# Patient Record
Sex: Male | Born: 1940 | Race: Black or African American | Hispanic: No | Marital: Married | Smoking: Current every day smoker
Health system: Southern US, Community
[De-identification: ages and names within clinical notes are randomized; demographics above are authoritative.]

## PROBLEM LIST (undated history)

## (undated) DIAGNOSIS — M109 Gout, unspecified: Secondary | ICD-10-CM

## (undated) DIAGNOSIS — K219 Gastro-esophageal reflux disease without esophagitis: Secondary | ICD-10-CM

## (undated) DIAGNOSIS — F419 Anxiety disorder, unspecified: Secondary | ICD-10-CM

## (undated) DIAGNOSIS — E119 Type 2 diabetes mellitus without complications: Secondary | ICD-10-CM

## (undated) DIAGNOSIS — N200 Calculus of kidney: Secondary | ICD-10-CM

## (undated) HISTORY — PX: CHOLECYSTECTOMY: SHX55

---

## 2000-10-12 ENCOUNTER — Encounter: Payer: Self-pay | Admitting: Internal Medicine

## 2000-10-12 ENCOUNTER — Ambulatory Visit (HOSPITAL_COMMUNITY): Admission: RE | Admit: 2000-10-12 | Discharge: 2000-10-12 | Payer: Self-pay | Admitting: Internal Medicine

## 2006-10-30 ENCOUNTER — Emergency Department (HOSPITAL_COMMUNITY): Admission: EM | Admit: 2006-10-30 | Discharge: 2006-10-30 | Payer: Self-pay | Admitting: Family Medicine

## 2010-12-12 ENCOUNTER — Inpatient Hospital Stay (INDEPENDENT_AMBULATORY_CARE_PROVIDER_SITE_OTHER)
Admission: RE | Admit: 2010-12-12 | Discharge: 2010-12-12 | Disposition: A | Discharge status: Home / Self Care | Payer: Medicare Other | Source: Ambulatory Visit | Attending: Family Medicine | Admitting: Family Medicine

## 2010-12-12 DIAGNOSIS — K219 Gastro-esophageal reflux disease without esophagitis: Secondary | ICD-10-CM

## 2010-12-12 DIAGNOSIS — R319 Hematuria, unspecified: Secondary | ICD-10-CM

## 2010-12-12 LAB — POCT URINALYSIS DIP (DEVICE)
Glucose, UA: NEGATIVE mg/dL
Ketones, ur: NEGATIVE mg/dL
Leukocytes, UA: NEGATIVE
Nitrite: NEGATIVE
Protein, ur: NEGATIVE mg/dL
Specific Gravity, Urine: 1.025 (ref 1.005–1.030)
Urobilinogen, UA: 0.2 mg/dL (ref 0.0–1.0)
pH: 5.5 (ref 5.0–8.0)

## 2010-12-19 ENCOUNTER — Inpatient Hospital Stay (INDEPENDENT_AMBULATORY_CARE_PROVIDER_SITE_OTHER)
Admission: RE | Admit: 2010-12-19 | Discharge: 2010-12-19 | Disposition: A | Discharge status: Home / Self Care | Payer: Medicare Other | Source: Ambulatory Visit | Attending: Emergency Medicine | Admitting: Emergency Medicine

## 2010-12-19 DIAGNOSIS — R319 Hematuria, unspecified: Secondary | ICD-10-CM

## 2010-12-19 LAB — POCT URINALYSIS DIP (DEVICE)
Bilirubin Urine: NEGATIVE
Glucose, UA: NEGATIVE mg/dL
Ketones, ur: NEGATIVE mg/dL
Leukocytes, UA: NEGATIVE
Nitrite: NEGATIVE
Protein, ur: NEGATIVE mg/dL
Specific Gravity, Urine: 1.02 (ref 1.005–1.030)
Urobilinogen, UA: 0.2 mg/dL (ref 0.0–1.0)
pH: 5.5 (ref 5.0–8.0)

## 2010-12-20 LAB — URINE CULTURE
Colony Count: NO GROWTH
Culture  Setup Time: 201207151716
Culture: NO GROWTH

## 2017-12-06 ENCOUNTER — Inpatient Hospital Stay (HOSPITAL_COMMUNITY)
Admission: EM | Admit: 2017-12-06 | Discharge: 2017-12-09 | DRG: 682 | Disposition: A | Discharge status: Home Health | Payer: Medicare Other | Attending: Internal Medicine | Admitting: Internal Medicine

## 2017-12-06 ENCOUNTER — Emergency Department (HOSPITAL_COMMUNITY): Payer: Medicare Other

## 2017-12-06 ENCOUNTER — Encounter (HOSPITAL_COMMUNITY): Payer: Self-pay | Admitting: Emergency Medicine

## 2017-12-06 ENCOUNTER — Other Ambulatory Visit: Payer: Self-pay

## 2017-12-06 DIAGNOSIS — K219 Gastro-esophageal reflux disease without esophagitis: Secondary | ICD-10-CM | POA: Diagnosis not present

## 2017-12-06 DIAGNOSIS — N179 Acute kidney failure, unspecified: Secondary | ICD-10-CM | POA: Diagnosis not present

## 2017-12-06 DIAGNOSIS — R748 Abnormal levels of other serum enzymes: Secondary | ICD-10-CM | POA: Diagnosis not present

## 2017-12-06 DIAGNOSIS — M6282 Rhabdomyolysis: Secondary | ICD-10-CM | POA: Diagnosis present

## 2017-12-06 DIAGNOSIS — F17210 Nicotine dependence, cigarettes, uncomplicated: Secondary | ICD-10-CM | POA: Diagnosis present

## 2017-12-06 DIAGNOSIS — R251 Tremor, unspecified: Secondary | ICD-10-CM

## 2017-12-06 DIAGNOSIS — R252 Cramp and spasm: Secondary | ICD-10-CM

## 2017-12-06 DIAGNOSIS — R7401 Elevation of levels of liver transaminase levels: Secondary | ICD-10-CM

## 2017-12-06 DIAGNOSIS — E860 Dehydration: Secondary | ICD-10-CM | POA: Diagnosis present

## 2017-12-06 DIAGNOSIS — Z7984 Long term (current) use of oral hypoglycemic drugs: Secondary | ICD-10-CM

## 2017-12-06 DIAGNOSIS — R066 Hiccough: Secondary | ICD-10-CM | POA: Diagnosis not present

## 2017-12-06 DIAGNOSIS — E871 Hypo-osmolality and hyponatremia: Secondary | ICD-10-CM | POA: Diagnosis present

## 2017-12-06 DIAGNOSIS — G9341 Metabolic encephalopathy: Secondary | ICD-10-CM | POA: Diagnosis present

## 2017-12-06 DIAGNOSIS — E119 Type 2 diabetes mellitus without complications: Secondary | ICD-10-CM

## 2017-12-06 DIAGNOSIS — Z79899 Other long term (current) drug therapy: Secondary | ICD-10-CM

## 2017-12-06 DIAGNOSIS — Z720 Tobacco use: Secondary | ICD-10-CM

## 2017-12-06 DIAGNOSIS — K760 Fatty (change of) liver, not elsewhere classified: Secondary | ICD-10-CM | POA: Diagnosis present

## 2017-12-06 DIAGNOSIS — R740 Nonspecific elevation of levels of transaminase and lactic acid dehydrogenase [LDH]: Secondary | ICD-10-CM

## 2017-12-06 HISTORY — DX: Gastro-esophageal reflux disease without esophagitis: K21.9

## 2017-12-06 HISTORY — DX: Calculus of kidney: N20.0

## 2017-12-06 HISTORY — DX: Type 2 diabetes mellitus without complications: E11.9

## 2017-12-06 HISTORY — DX: Gout, unspecified: M10.9

## 2017-12-06 HISTORY — DX: Anxiety disorder, unspecified: F41.9

## 2017-12-06 LAB — COMPREHENSIVE METABOLIC PANEL
ALBUMIN: 3.8 g/dL (ref 3.5–5.0)
ALK PHOS: 73 U/L (ref 38–126)
ALT: 53 U/L — AB (ref 0–44)
AST: 56 U/L — AB (ref 15–41)
Anion gap: 10 (ref 5–15)
BUN: 49 mg/dL — AB (ref 8–23)
CALCIUM: 8.1 mg/dL — AB (ref 8.9–10.3)
CHLORIDE: 101 mmol/L (ref 98–111)
CO2: 20 mmol/L — AB (ref 22–32)
CREATININE: 2.71 mg/dL — AB (ref 0.61–1.24)
GFR calc Af Amer: 24 mL/min — ABNORMAL LOW (ref >60)
GFR calc non Af Amer: 21 mL/min — ABNORMAL LOW (ref >60)
GLUCOSE: 157 mg/dL — AB (ref 70–99)
Potassium: 4.9 mmol/L (ref 3.5–5.1)
SODIUM: 131 mmol/L — AB (ref 135–145)
Total Bilirubin: 1 mg/dL (ref 0.3–1.2)
Total Protein: 7.4 g/dL (ref 6.5–8.1)

## 2017-12-06 LAB — URINALYSIS, ROUTINE W REFLEX MICROSCOPIC
BILIRUBIN URINE: NEGATIVE
Bacteria, UA: NONE SEEN
Glucose, UA: NEGATIVE mg/dL
Ketones, ur: NEGATIVE mg/dL
LEUKOCYTES UA: NEGATIVE
Nitrite: NEGATIVE
PROTEIN: 100 mg/dL — AB
Specific Gravity, Urine: 1.02 (ref 1.005–1.030)
pH: 5 (ref 5.0–8.0)

## 2017-12-06 LAB — CBC
HEMATOCRIT: 38.4 % — AB (ref 39.0–52.0)
HEMOGLOBIN: 12.4 g/dL — AB (ref 13.0–17.0)
MCH: 26.6 pg (ref 26.0–34.0)
MCHC: 32.3 g/dL (ref 30.0–36.0)
MCV: 82.2 fL (ref 78.0–100.0)
PLATELETS: 190 10*3/uL (ref 150–400)
RBC: 4.67 MIL/uL (ref 4.22–5.81)
RDW: 15.7 % — ABNORMAL HIGH (ref 11.5–15.5)
WBC: 3.3 10*3/uL — ABNORMAL LOW (ref 4.0–10.5)

## 2017-12-06 LAB — CK: CK TOTAL: 521 U/L — AB (ref 49–397)

## 2017-12-06 LAB — CBG MONITORING, ED
GLUCOSE-CAPILLARY: 167 mg/dL — AB (ref 70–99)
Glucose-Capillary: 161 mg/dL — ABNORMAL HIGH (ref 70–99)

## 2017-12-06 LAB — AMMONIA: Ammonia: 32 umol/L (ref 9–35)

## 2017-12-06 LAB — DIFFERENTIAL
BASOS ABS: 0 10*3/uL (ref 0.0–0.1)
BASOS PCT: 0 %
Eosinophils Absolute: 0 10*3/uL (ref 0.0–0.7)
Eosinophils Relative: 0 %
LYMPHS PCT: 32 %
Lymphs Abs: 1 10*3/uL (ref 0.7–4.0)
Monocytes Absolute: 0.7 10*3/uL (ref 0.1–1.0)
Monocytes Relative: 20 %
NEUTROS ABS: 1.6 10*3/uL — AB (ref 1.7–7.7)
NEUTROS PCT: 48 %

## 2017-12-06 LAB — TROPONIN I

## 2017-12-06 LAB — RAPID URINE DRUG SCREEN, HOSP PERFORMED
Amphetamines: NOT DETECTED
Benzodiazepines: POSITIVE — AB
Cocaine: NOT DETECTED
Opiates: NOT DETECTED
TETRAHYDROCANNABINOL: NOT DETECTED

## 2017-12-06 LAB — ACETAMINOPHEN LEVEL: Acetaminophen (Tylenol), Serum: <10 ug/mL — ABNORMAL LOW (ref 10–30)

## 2017-12-06 LAB — PROTIME-INR
INR: 1.01
Prothrombin Time: 13.3 seconds (ref 11.4–15.2)

## 2017-12-06 LAB — SALICYLATE LEVEL: Salicylate Lvl: <7 mg/dL (ref 2.8–30.0)

## 2017-12-06 LAB — ETHANOL

## 2017-12-06 LAB — APTT: aPTT: 36 seconds (ref 24–36)

## 2017-12-06 MED ORDER — METOCLOPRAMIDE HCL 5 MG/ML IJ SOLN
5.0000 mg | Freq: Once | INTRAMUSCULAR | Status: AC
  Administered 2017-12-06: 5 mg via INTRAVENOUS
  Filled 2017-12-06: qty 2

## 2017-12-06 MED ORDER — SODIUM CHLORIDE 0.9 % IV BOLUS
500.0000 mL | Freq: Once | INTRAVENOUS | Status: AC
  Administered 2017-12-06: 500 mL via INTRAVENOUS

## 2017-12-06 MED ORDER — SODIUM CHLORIDE 0.9 % IV SOLN
INTRAVENOUS | Status: DC
  Administered 2017-12-06 – 2017-12-09 (×7): via INTRAVENOUS

## 2017-12-06 MED ORDER — DIPHENHYDRAMINE HCL 50 MG/ML IJ SOLN
25.0000 mg | Freq: Once | INTRAMUSCULAR | Status: AC
  Administered 2017-12-06: 25 mg via INTRAVENOUS
  Filled 2017-12-06: qty 1

## 2017-12-06 MED ORDER — SODIUM CHLORIDE 0.9 % IV BOLUS
1000.0000 mL | Freq: Once | INTRAVENOUS | Status: AC
  Administered 2017-12-06: 1000 mL via INTRAVENOUS

## 2017-12-06 NOTE — ED Notes (Signed)
Patient transported to X-ray 

## 2017-12-06 NOTE — ED Provider Notes (Signed)
[facility] EMERGENCY DEPARTMENT Provider Note   CSN: [id number] Arrival date & time: 12/06/17  1411     History   Chief Complaint Chief Complaint  Patient presents with  . Weakness    HPI Tyler Rodgers is a 77 y.o. male.  The history is provided by the patient, the spouse and a relative. The history is limited by the condition of the patient (limited historian).  Weakness   Pt was seen at 1550. Per pt, pt's wife and daughter:  Pt presents to ED today for unknown symptoms which may have started 2 weeks ago, 1 week ago, or Sunday (3 days ago). Symptoms may include: not talking a lot, generalized muscles cramping, not sleeping well, hiccups. At one point, pt's wife mentions the pt had CP, which pt denies. Pt's daughter states pt was evaluated by his PMD 2 days ago for unknown symptoms and "prescribed a medicine" (which is unknown to pt, pt's wife and pt's daughter). Pt states he "doesn't remember" what he was told 2 days ago at his doctor's office or why he went. Pt's daughter showed ED staff a picture of a pill bottle (Pletal), but pt, pt's wife, and pt's daughter do not know when pt filled it, who prescribed it, or for what medical condition. Pt currently only c/o hiccups and generalized muscles cramping and is asking for "some medicine to stop it." Denies CP, abd pain, back pain, N/V/D, fevers, focal motor weakness, no tingling/numbness in extremities.     Past Medical History:  Diagnosis Date  . Anxiety   . Diabetes mellitus without complication ([place])   . GERD (gastroesophageal reflux disease)   . Gout   . Kidney stone     Patient Active Problem List   Diagnosis Date Noted  . AKI (acute kidney injury) ([place]) 12/06/2017    Past Surgical History:  Procedure Laterality Date  . CHOLECYSTECTOMY          Home Medications    Prior to Admission medications   Medication Sig Start Date End Date Taking? Authorizing Provider  cilostazol (PLETAL) 50 MG tablet Take 1 tablet  by mouth 2 (two) times daily. 12/02/17   [provider]  metFORMIN (GLUCOPHAGE-XR) 500 MG 24 hr tablet Take 1 tablet by mouth 2 (two) times daily. 10/22/17   [provider]  Vitamin D, Ergocalciferol, (DRISDOL) 50000 units CAPS capsule Take 1 capsule by mouth once a week. 09/24/17   [provider]    Family History History reviewed. No pertinent family history.  Social History Social History   Tobacco Use  . Smoking status: Current Every Day Smoker    Packs/day: 0.50  . Smokeless tobacco: Never Used  Substance Use Topics  . Alcohol use: Not Currently  . Drug use: Not on file     Allergies   Patient has no allergy information on record.   Review of Systems Review of Systems  Unable to perform ROS: Other  Neurological: Positive for weakness.     Physical Exam Updated Vital Signs BP 127/68   Pulse 72   Temp 98.6 F (37 C)   Resp (!) 29   Ht 5\' 9"  (1.753 m)   Wt 86.2 kg (190 lb)   SpO2 98%   BMI 28.06 kg/m   Physical Exam 1555: Physical examination:  Nursing notes reviewed; Vital signs and O2 SAT reviewed;  Constitutional: Well developed, Well nourished, Restless on stretcher.; Head:  Normocephalic, atraumatic; Eyes: EOMI, PERRL, No scleral icterus; ENMT: Mouth and  pharynx normal, Mucous membranes dry; Neck: Supple, Full range of motion, No lymphadenopathy; Cardiovascular: Regular rate and rhythm, No gallop; Respiratory: Breath sounds clear & equal bilaterally, No wheezes. Normal respiratory effort/excursion; Chest: Nontender, Movement normal; Abdomen: +hiccups during exam. Soft, Nontender, Nondistended, Normal bowel sounds; Genitourinary: No CVA tenderness; Extremities: Peripheral pulses normal, No deformity. Pelvis stable. UE's and LE's muscles compartments without tenderness. +1 pedal edema bilat, No calf tenderness or asymmetry.; Neuro: Awake, alert, confused re: events. No facial droop. Speech mumbling. Grips equal. Strength 5/5 equal bilat  UE's and LE's. No gross focal motor deficits in extremities.; Skin: Color normal, Warm, Dry.   ED Treatments / Results  Labs (all labs ordered are listed, but only abnormal results are displayed)   EKG EKG Interpretation  Date/Time:  Wednesday December 06 2017 14:20:28 EDT Ventricular Rate:  74 PR Interval:  126 QRS Duration: 84 QT Interval:  390 QTC Calculation: 432 R Axis:   53 Text Interpretation:  Sinus rhythm with Premature atrial complexes Otherwise normal ECG No old tracing to compare Confirmed by Francine Graven [id number]) on 12/06/2017 3:19:21 PM   Radiology   Procedures Procedures (including critical care time)  Medications Ordered in ED Medications  sodium chloride 0.9 % bolus 500 mL (500 mLs Intravenous New Bag/Given 12/06/17 1647)  0.9 %  sodium chloride infusion (has no administration in time range)  metoCLOPramide (REGLAN) injection 5 mg (5 mg Intravenous Given 12/06/17 1533)  diphenhydrAMINE (BENADRYL) injection 25 mg (25 mg Intravenous Given 12/06/17 1535)     Initial Impression / Assessment and Plan / ED Course  I have reviewed the triage vital signs and the nursing notes.  Pertinent labs & imaging results that were available during my care of the patient were reviewed by me and considered in my medical decision making (see chart for details).  MDM Reviewed: previous chart, nursing note and vitals Interpretation: labs, x-ray, ECG and CT scan   Results for orders placed or performed during the hospital encounter of 12/06/17  Urinalysis, Routine w reflex microscopic  Result Value Ref Range   Color, Urine YELLOW YELLOW   APPearance CLEAR CLEAR   Specific Gravity, Urine 1.020 1.005 - 1.030   pH 5.0 5.0 - 8.0   Glucose, UA NEGATIVE NEGATIVE mg/dL   Hgb urine dipstick SMALL (A) NEGATIVE   Bilirubin Urine NEGATIVE NEGATIVE   Ketones, ur NEGATIVE NEGATIVE mg/dL   Protein, ur 100 (A) NEGATIVE mg/dL   Nitrite NEGATIVE NEGATIVE   Leukocytes, UA NEGATIVE NEGATIVE     RBC / HPF 0-5 0 - 5 RBC/hpf   WBC, UA 0-5 0 - 5 WBC/hpf   Bacteria, UA NONE SEEN NONE SEEN   Squamous Epithelial / LPF 0-5 0 - 5   Hyaline Casts, UA PRESENT   Ethanol  Result Value Ref Range   Alcohol, Ethyl (B) <10 <10 mg/dL  Protime-INR  Result Value Ref Range   Prothrombin Time 13.3 11.4 - 15.2 seconds   INR 1.01   APTT  Result Value Ref Range   aPTT 36 24 - 36 seconds  CBC  Result Value Ref Range   WBC 3.3 (L) 4.0 - 10.5 K/uL   RBC 4.67 4.22 - 5.81 MIL/uL   Hemoglobin 12.4 (L) 13.0 - 17.0 g/dL   HCT 38.4 (L) 39.0 - 52.0 %   MCV 82.2 78.0 - 100.0 fL   MCH 26.6 26.0 - 34.0 pg   MCHC 32.3 30.0 - 36.0 g/dL   RDW 15.7 (H) 11.5 - 15.5 %  Platelets 190 150 - 400 K/uL  Differential  Result Value Ref Range   Neutrophils Relative % 48 %   Neutro Abs 1.6 (L) 1.7 - 7.7 K/uL   Lymphocytes Relative 32 %   Lymphs Abs 1.0 0.7 - 4.0 K/uL   Monocytes Relative 20 %   Monocytes Absolute 0.7 0.1 - 1.0 K/uL   Eosinophils Relative 0 %   Eosinophils Absolute 0.0 0.0 - 0.7 K/uL   Basophils Relative 0 %   Basophils Absolute 0.0 0.0 - 0.1 K/uL  Comprehensive metabolic panel  Result Value Ref Range   Sodium 131 (L) 135 - 145 mmol/L   Potassium 4.9 3.5 - 5.1 mmol/L   Chloride 101 98 - 111 mmol/L   CO2 20 (L) 22 - 32 mmol/L   Glucose, Bld 157 (H) 70 - 99 mg/dL   BUN 49 (H) 8 - 23 mg/dL   Creatinine, Ser 2.71 (H) 0.61 - 1.24 mg/dL   Calcium 8.1 (L) 8.9 - 10.3 mg/dL   Total Protein 7.4 6.5 - 8.1 g/dL   Albumin 3.8 3.5 - 5.0 g/dL   AST 56 (H) 15 - 41 U/L   ALT 53 (H) 0 - 44 U/L   Alkaline Phosphatase 73 38 - 126 U/L   Total Bilirubin 1.0 0.3 - 1.2 mg/dL   GFR calc non Af Amer 21 (L) >60 mL/min   GFR calc Af Amer 24 (L) >60 mL/min   Anion gap 10 5 - 15  Urine rapid drug screen (hosp performed)not at [facility]  Result Value Ref Range   Opiates NONE DETECTED NONE DETECTED   Cocaine NONE DETECTED NONE DETECTED   Benzodiazepines POSITIVE (A) NONE DETECTED   Amphetamines NONE DETECTED NONE  DETECTED   Tetrahydrocannabinol NONE DETECTED NONE DETECTED   Barbiturates (A) NONE DETECTED    Result not available. Reagent lot number recalled by manufacturer.  Troponin I  Result Value Ref Range   Troponin I <0.03 <0.03 ng/mL  CK  Result Value Ref Range   Total CK 521 (H) 49 - 397 U/L  Acetaminophen level  Result Value Ref Range   Acetaminophen (Tylenol), Serum <10 (L) 10 - 30 ug/mL  Salicylate level  Result Value Ref Range   Salicylate Lvl <1.6 2.8 - 30.0 mg/dL  Ammonia  Result Value Ref Range   Ammonia 32 9 - 35 umol/L  CBG monitoring, ED  Result Value Ref Range   Glucose-Capillary 161 (H) 70 - 99 mg/dL  CBG monitoring, ED  Result Value Ref Range   Glucose-Capillary 167 (H) 70 - 99 mg/dL   Dg Chest 2 View Result Date: 12/06/2017 CLINICAL DATA:  Weakness EXAM: CHEST - 2 VIEW COMPARISON:  None. FINDINGS: The heart size and mediastinal contours are within normal limits. Both lungs are clear. The visualized skeletal structures are unremarkable. IMPRESSION: No active cardiopulmonary disease. Electronically Signed   By: Ulyses Jarred M.D.   On: 12/06/2017 15:26   Ct Head Wo Contrast Result Date: 12/06/2017 CLINICAL DATA:  Altered mental status EXAM: CT HEAD WITHOUT CONTRAST TECHNIQUE: Contiguous axial images were obtained from the base of the skull through the vertex without intravenous contrast. COMPARISON:  None. FINDINGS: Brain: No evidence of acute infarction, hemorrhage, hydrocephalus, extra-axial collection or mass lesion/mass effect. Vascular: No hyperdense vessel or unexpected calcification. Skull: No osseous abnormality. Sinuses/Orbits: Visualized paranasal sinuses are clear. Visualized mastoid sinuses are clear. Visualized orbits demonstrate no focal abnormality. Other: None IMPRESSION: No acute intracranial pathology. Electronically Signed   By: Kathreen Devoid  On: 12/06/2017 15:38   Dg Abd 2 Views Result Date: 12/06/2017 CLINICAL DATA:  Generalized weakness and intermittent  abdominal cramping for 2 days. EXAM: ABDOMEN - 2 VIEW COMPARISON:  None. FINDINGS: The bowel gas pattern is normal. There is no evidence of free air. No radio-opaque calculi or other significant radiographic abnormality is seen. Mild convex right lumbar scoliosis and multilevel degenerative change noted. IMPRESSION: No acute abnormality. Electronically Signed   By: Inge Rise M.D.   On: 12/06/2017 15:27    1505:  Pt, wife and daughter are all very difficult historians:  Answers to most questions asked by ED RN's and myself are "I don't know" including what symptoms brought pt to the ED today, as well as when they started. Pt near continually hiccupping during exam and c/o generalized muscles cramping, appearing uncomfortable. Workup ordered. Low dose IV reglan and benadryl given. T/C returned from [place], case discussed, including:  HPI, pertinent PM/SHx, VS/PE, dx testing, ED course and treatment:  Pt was seen in office on Monday for muscles cramping, runny/stuffy nose and sore throat with negative strep swab and advice to purchase OTC zyrtec and flonase for his symptoms (thought to be viral or allergy); Pletal rx not on their rx list for patient.    1710:  UDS +benzos; [place] PMP Database accessed:  Xanax 0.5mg , #90, was filled 03/27/2017.  BUN/Cr elevated as well as CK total; IVF bolus and gtt started. Pt is no longer hiccupping and appears much more comfortable laying on stretcher. Pt remains awake/alert, resps easy, abd soft/NT, neuro non-focal. T/C returned from [facility] Dr. Shanon Brow, case discussed, including:  HPI, pertinent PM/SHx, VS/PE, dx testing, ED course and treatment:  Agreeable to admit.      Final Clinical Impressions(s) / ED Diagnoses   Final diagnoses:  Hiccup  AKI (acute kidney injury) ([place])  Elevated CK  Muscle cramping    ED Discharge Orders    None       Francine Graven, DO 12/10/17 9417

## 2017-12-06 NOTE — ED Notes (Signed)
Both pt and wife are poor historian. Wife now stating sx have been going on for a week. Pt has not been sleeping well for the past week. Pt c/o hiccups at this time.

## 2017-12-06 NOTE — Progress Notes (Signed)
Pt not able to stand at this time for [facility].

## 2017-12-06 NOTE — ED Triage Notes (Signed)
Pt c/o generalized weakness. Per spouse pt started having decreased speaking, HA, shaking, and was walking into walls on Sunday. Was seen by PCP Monday at [place], pt does not remember what MD told him and wife was not present. Pt cannot remember what medication it was.

## 2017-12-06 NOTE — H&P (Signed)
History and Physical    Tyler Rodgers ERD:[id number] DOB: 12/18/1940 DOA: 12/06/2017  PCP: [place], [place]  Patient coming from: Home  Chief Complaint: Cramps and hiccups  HPI: Tyler Rodgers is a 77 y.o. male with medical history significant of diabetes, anxiety comes in with over a week of muscle cramps and hiccups.  Patient and family are extremely poor historians and really cannot get why they came to the emergency department today.  He does not know if he has any renal problems.  He denies any nausea vomiting or diarrhea.  He denies any chest pain or shortness of breath although his family says that he was complaining of chest pain earlier.  He denies any fevers.  He denies any cough.  Patient was given some Reglan for his hiccups in the ED and his hiccups have resolved.  His cramps have also resolved.  Patient found to be in acute kidney injury with mild creatinine with unknown baseline.  Patient was referred for admission for dehydration and the need for IV fluids.  Review of Systems: As per HPI otherwise 10 point review of systems negative.   Past Medical History:  Diagnosis Date  . Anxiety   . Diabetes mellitus without complication ([place])   . GERD (gastroesophageal reflux disease)   . Gout   . Kidney stone     Past Surgical History:  Procedure Laterality Date  . CHOLECYSTECTOMY       reports that he has been smoking.  He has been smoking about 0.50 packs per day. He has never used smokeless tobacco. He reports that he drank alcohol. His drug history is not on file.  Allergies not on file  History reviewed. No pertinent family history.  No premature coronary artery disease  Prior to Admission medications   Medication Sig Start Date End Date Taking? Authorizing Provider  cilostazol (PLETAL) 50 MG tablet Take 1 tablet by mouth 2 (two) times daily. 12/02/17   [provider]  metFORMIN (GLUCOPHAGE-XR) 500 MG 24 hr tablet Take 1 tablet by mouth 2  (two) times daily. 10/22/17   [provider]  Vitamin D, Ergocalciferol, (DRISDOL) 50000 units CAPS capsule Take 1 capsule by mouth once a week. 09/24/17   [provider]    Physical Exam: Vitals:   12/06/17 1730 12/06/17 1745 12/06/17 1800 12/06/17 1815  BP: 130/75  124/67   Pulse: 70 78 75 77  Resp: (!) 26 16 (!) 28 17  Temp:      SpO2: 99% 99% 97% 96%  Weight:      Height:          Constitutional: NAD, calm, comfortable Vitals:   12/06/17 1730 12/06/17 1745 12/06/17 1800 12/06/17 1815  BP: 130/75  124/67   Pulse: 70 78 75 77  Resp: (!) 26 16 (!) 28 17  Temp:      SpO2: 99% 99% 97% 96%  Weight:      Height:       Eyes: PERRL, lids and conjunctivae normal ENMT: Mucous membranes are moist. Posterior pharynx clear of any exudate or lesions.Normal dentition.  Neck: normal, supple, no masses, no thyromegaly Respiratory: clear to auscultation bilaterally, no wheezing, no crackles. Normal respiratory effort. No accessory muscle use.  Cardiovascular: Regular rate and rhythm, no murmurs / rubs / gallops. No extremity edema. 2+ pedal pulses. No carotid bruits.  Abdomen: no tenderness, no masses palpated. No hepatosplenomegaly. Bowel sounds positive.  Musculoskeletal: no clubbing / cyanosis. No joint deformity  upper and lower extremities. Good ROM, no contractures. Normal muscle tone.  Skin: no rashes, lesions, ulcers. No induration Neurologic: CN 2-12 grossly intact. Sensation intact, DTR normal. Strength 5/5 in all 4.  Psychiatric: Normal judgment and insight. Alert and oriented x 3. Normal mood.    Labs on Admission: I have personally reviewed following labs and imaging studies  CBC: Recent Labs  Lab 12/06/17 1453  WBC 3.3*  NEUTROABS 1.6*  HGB 12.4*  HCT 38.4*  MCV 82.2  PLT 956   Basic Metabolic Panel: Recent Labs  Lab 12/06/17 1453  NA 131*  K 4.9  CL 101  CO2 20*  GLUCOSE 157*  BUN 49*  CREATININE 2.71*  CALCIUM 8.1*   GFR: Estimated  Creatinine Clearance: 24 mL/min (A) (by C-G formula based on SCr of 2.71 mg/dL (H)). Liver Function Tests: Recent Labs  Lab 12/06/17 1453  AST 56*  ALT 53*  ALKPHOS 73  BILITOT 1.0  PROT 7.4  ALBUMIN 3.8   No results for input(s): LIPASE, AMYLASE in the last 168 hours. Recent Labs  Lab 12/06/17 1556  AMMONIA 32   Coagulation Profile: Recent Labs  Lab 12/06/17 1453  INR 1.01   Cardiac Enzymes: Recent Labs  Lab 12/06/17 1453  CKTOTAL 521*  TROPONINI <0.03   BNP (last 3 results) No results for input(s): PROBNP in the last 8760 hours. HbA1C: No results for input(s): HGBA1C in the last 72 hours. CBG: Recent Labs  Lab 12/06/17 1431 12/06/17 1457  GLUCAP 161* 167*   Lipid Profile: No results for input(s): CHOL, HDL, LDLCALC, TRIG, CHOLHDL, LDLDIRECT in the last 72 hours. Thyroid Function Tests: No results for input(s): TSH, T4TOTAL, FREET4, T3FREE, THYROIDAB in the last 72 hours. Anemia Panel: No results for input(s): VITAMINB12, FOLATE, FERRITIN, TIBC, IRON, RETICCTPCT in the last 72 hours. Urine analysis:    Component Value Date/Time   COLORURINE YELLOW 12/06/2017 1426   APPEARANCEUR CLEAR 12/06/2017 1426   LABSPEC 1.020 12/06/2017 1426   PHURINE 5.0 12/06/2017 1426   GLUCOSEU NEGATIVE 12/06/2017 1426   HGBUR SMALL (A) 12/06/2017 1426   BILIRUBINUR NEGATIVE 12/06/2017 1426   KETONESUR NEGATIVE 12/06/2017 1426   PROTEINUR 100 (A) 12/06/2017 1426   UROBILINOGEN 0.2 12/19/2010 0935   NITRITE NEGATIVE 12/06/2017 1426   LEUKOCYTESUR NEGATIVE 12/06/2017 1426   Sepsis Labs: !!!!!!!!!!!!!!!!!!!!!!!!!!!!!!!!!!!!!!!!!!!! @LABRCNTIP (procalcitonin:4,lacticidven:4) )No results found for this or any previous visit (from the past 240 hour(s)).   Radiological Exams on Admission: Dg Chest 2 View  Result Date: 12/06/2017 CLINICAL DATA:  Weakness EXAM: CHEST - 2 VIEW COMPARISON:  None. FINDINGS: The heart size and mediastinal contours are within normal limits. Both lungs  are clear. The visualized skeletal structures are unremarkable. IMPRESSION: No active cardiopulmonary disease. Electronically Signed   By: Ulyses Jarred M.D.   On: 12/06/2017 15:26   Ct Head Wo Contrast  Result Date: 12/06/2017 CLINICAL DATA:  Altered mental status EXAM: CT HEAD WITHOUT CONTRAST TECHNIQUE: Contiguous axial images were obtained from the base of the skull through the vertex without intravenous contrast. COMPARISON:  None. FINDINGS: Brain: No evidence of acute infarction, hemorrhage, hydrocephalus, extra-axial collection or mass lesion/mass effect. Vascular: No hyperdense vessel or unexpected calcification. Skull: No osseous abnormality. Sinuses/Orbits: Visualized paranasal sinuses are clear. Visualized mastoid sinuses are clear. Visualized orbits demonstrate no focal abnormality. Other: None IMPRESSION: No acute intracranial pathology. Electronically Signed   By: Kathreen Devoid   On: 12/06/2017 15:38   Dg Abd 2 Views  Result Date: 12/06/2017 CLINICAL DATA:  Generalized weakness and intermittent abdominal cramping for 2 days. EXAM: ABDOMEN - 2 VIEW COMPARISON:  None. FINDINGS: The bowel gas pattern is normal. There is no evidence of free air. No radio-opaque calculi or other significant radiographic abnormality is seen. Mild convex right lumbar scoliosis and multilevel degenerative change noted. IMPRESSION: No acute abnormality. Electronically Signed   By: Inge Rise M.D.   On: 12/06/2017 15:27    Old chart reviewed Case discussed with EDP Dr. Thurnell Garbe Chest x-ray reviewed no edema or infiltrate  Assessment/Plan 77 year old male with muscle cramps found to have acute kidney injury Principal Problem:   AKI (acute kidney injury) ([facility])-creatinine 2.7 with unknown baseline.  IV fluids.  Repeat in the morning.  Urinalysis is pending.  Active Problems:   Diabetes mellitus without complication ([facility])-noted   GERD (gastroesophageal reflux disease)-stable Hiccups-resolved   DVT  prophylaxis: SCDs Code Status: Full Family Communication: Sister-in-law Disposition Plan: Likely tomorrow Consults called: None Admission status: Observation   Phylicia Mcgaugh A MD [facility]  If 7PM-7AM, please contact night-coverage [email or web address] Password [facility]  12/06/2017, 7:55 PM

## 2017-12-06 NOTE — ED Notes (Signed)
Pt or pt's wife unable to state for reason for visit today, pt keeps mentioning about abd cramps that come and go which pt has hx of but has gotten worse for past 2 days.

## 2017-12-07 ENCOUNTER — Observation Stay (HOSPITAL_COMMUNITY): Payer: Medicare Other

## 2017-12-07 DIAGNOSIS — G9341 Metabolic encephalopathy: Secondary | ICD-10-CM

## 2017-12-07 DIAGNOSIS — N179 Acute kidney failure, unspecified: Secondary | ICD-10-CM | POA: Diagnosis not present

## 2017-12-07 DIAGNOSIS — Z720 Tobacco use: Secondary | ICD-10-CM

## 2017-12-07 DIAGNOSIS — R740 Nonspecific elevation of levels of transaminase and lactic acid dehydrogenase [LDH]: Secondary | ICD-10-CM

## 2017-12-07 DIAGNOSIS — M6282 Rhabdomyolysis: Secondary | ICD-10-CM | POA: Diagnosis not present

## 2017-12-07 DIAGNOSIS — R748 Abnormal levels of other serum enzymes: Secondary | ICD-10-CM | POA: Diagnosis not present

## 2017-12-07 DIAGNOSIS — R7401 Elevation of levels of liver transaminase levels: Secondary | ICD-10-CM

## 2017-12-07 LAB — BASIC METABOLIC PANEL
Anion gap: 7 (ref 5–15)
BUN: 38 mg/dL — AB (ref 8–23)
CHLORIDE: 107 mmol/L (ref 98–111)
CO2: 20 mmol/L — AB (ref 22–32)
Calcium: 7.5 mg/dL — ABNORMAL LOW (ref 8.9–10.3)
Creatinine, Ser: 1.99 mg/dL — ABNORMAL HIGH (ref 0.61–1.24)
GFR calc Af Amer: 35 mL/min — ABNORMAL LOW (ref >60)
GFR calc non Af Amer: 30 mL/min — ABNORMAL LOW (ref >60)
Glucose, Bld: 118 mg/dL — ABNORMAL HIGH (ref 70–99)
POTASSIUM: 4.9 mmol/L (ref 3.5–5.1)
SODIUM: 134 mmol/L — AB (ref 135–145)

## 2017-12-07 LAB — HEMOGLOBIN A1C
HEMOGLOBIN A1C: 7.4 % — AB (ref 4.8–5.6)
Mean Plasma Glucose: 165.68 mg/dL

## 2017-12-07 LAB — CBC
HEMATOCRIT: 34.2 % — AB (ref 39.0–52.0)
Hemoglobin: 10.6 g/dL — ABNORMAL LOW (ref 13.0–17.0)
MCH: 25.9 pg — ABNORMAL LOW (ref 26.0–34.0)
MCHC: 31 g/dL (ref 30.0–36.0)
MCV: 83.4 fL (ref 78.0–100.0)
Platelets: 171 10*3/uL (ref 150–400)
RBC: 4.1 MIL/uL — ABNORMAL LOW (ref 4.22–5.81)
RDW: 15.8 % — AB (ref 11.5–15.5)
WBC: 2.7 10*3/uL — AB (ref 4.0–10.5)

## 2017-12-07 LAB — RAPID URINE DRUG SCREEN, HOSP PERFORMED
Amphetamines: NOT DETECTED
BENZODIAZEPINES: NOT DETECTED
COCAINE: NOT DETECTED
Opiates: NOT DETECTED
Tetrahydrocannabinol: NOT DETECTED

## 2017-12-07 LAB — FOLATE: Folate: 36 ng/mL (ref >5.9)

## 2017-12-07 LAB — TSH: TSH: 1.036 u[IU]/mL (ref 0.350–4.500)

## 2017-12-07 NOTE — Progress Notes (Signed)
Pt was not able to feed himself due to the shaking but was feed by sister in law.

## 2017-12-07 NOTE — Progress Notes (Signed)
PROGRESS NOTE  Tyler Rodgers YOV:[id number] DOB: 08-Dec-1940 DOA: 12/06/2017 PCP: [facility]  Brief History:  77 year old male with a history of diabetes mellitus, GERD presenting with 2 to 3-day history of increasing generalized weakness, lethargy, and muscle cramping.  Unfortunately, the patient is a poor historian.  His history is supplemented by the patient's wife and daughter at the bedside.  The patient was recently started on Pletal on 12/02/2017.  He denies any other over-the-counter medications besides multivitamin.  The patient has also had worsening unsteady gait for the past 1 to 2 weeks.  His family also notes increasing tremors for the past 1 to 2 weeks.  He denies any chest pain, shortness breath, nausea, vomiting, diarrhea, headaches, visual disturbance.  His family states that he has had unsteady gait with tremulousness for nearly 1 year, but this has been worse over the past 1 week.  Upon presentation, the patient was noted to have a serum creatinine of 2.71.  CPK was 521.  Urinalysis was negative for pyuria.  CT the brain was unremarkable.  Because of his acute kidney injury and tremors, the patient was admitted for further evaluation.  Assessment/Plan: Acute kidney injury -Secondary to volume depletion and a degree of rhabdomyolysis -Abdominal ultrasound -Urinalysis is bland -Continue IV fluids  Rhabdomyolysis, nontraumatic -CPK 521 -Continue IV fluids -Recheck CPK -Check urine drug screen  Hyponatremia -Secondary to volume depletion -Improving with IV fluids  Acute metabolic encephalopathy -Secondary to acute kidney injury -TSH -Serum X41 -Folic acid -Ammonia 32 -Urine drug screen  Gait instability/tremor -Concern the patient may have essential tremor versus other neurodegenerative disease such as Parkinson's -out patient referral to movement disorder specialist -MRI brain  Diabetes mellitus type 2 -Holding  metformin -NovoLog sliding scale -Hemoglobin A1c  Transaminasemia -Abd [place] -hep b surface antigen -hep C antibody  Tobacco abuse -I have discussed tobacco cessation with the patient.  I have counseled the patient regarding the negative impacts of continued tobacco use including but not limited to lung cancer, COPD, and cardiovascular disease.  I have discussed alternatives to tobacco and modalities that may help facilitate tobacco cessation including but not limited to biofeedback, hypnosis, and medications.  Total time spent with tobacco counseling was 4 minutes. -start bronchodilators     Disposition Plan:   Home 7/5 or 7/6 Family Communication:  Spouse updated at bedside 7/4--Total time spent 35 minutes.  Greater than 50% spent face to face counseling and coordinating care.   Consultants:  none Code Status:  FULL   DVT Prophylaxis:  SCDs   Procedures: As Listed in Progress Note Above  Antibiotics: None    Subjective: Patient continues to complain of some generalized weakness but it is better than yesterday.  He states that his muscle cramping is improved.  He denies any headache, chest pain, shortness breath, nausea, vomiting, diarrhea, abdominal pain.  There is no dysuria hematuria.  Objective: Vitals:   12/06/17 1800 12/06/17 1815 12/06/17 1958 12/07/17 0504  BP: 124/67  122/66 94/83  Pulse: 75 77 71 66  Resp: (!) 28 17    Temp:   100.1 F (37.8 C) 99.7 F (37.6 C)  TempSrc:   Oral Oral  SpO2: 97% 96% 100% 97%  Weight:      Height:        Intake/Output Summary (Last 24 hours) at 12/07/2017 0924 Last data filed at 12/07/2017 0841 Gross per 24 hour  Intake 1890 ml  Output 700 ml  Net 1190 ml   Weight change:  Exam:   General:  Pt is alert, follows commands appropriately, not in acute distress  HEENT: No icterus, No thrush, No neck mass, [place]/AT  Cardiovascular: RRR, S1/S2, no rubs, no gallops  Respiratory: Bibasilar rales.  Bibasilar wheeze.  Good air  movement.  Abdomen: Soft/+BS, non tender, non distended, no guarding  Extremities: No edema, No lymphangitis, No petechiae, No rashes, no synovitis   Data Reviewed: I have personally reviewed following labs and imaging studies Basic Metabolic Panel: Recent Labs  Lab 12/06/17 1453 12/07/17 0457  NA 131* 134*  K 4.9 4.9  CL 101 107  CO2 20* 20*  GLUCOSE 157* 118*  BUN 49* 38*  CREATININE 2.71* 1.99*  CALCIUM 8.1* 7.5*   Liver Function Tests: Recent Labs  Lab 12/06/17 1453  AST 56*  ALT 53*  ALKPHOS 73  BILITOT 1.0  PROT 7.4  ALBUMIN 3.8   No results for input(s): LIPASE, AMYLASE in the last 168 hours. Recent Labs  Lab 12/06/17 1556  AMMONIA 32   Coagulation Profile: Recent Labs  Lab 12/06/17 1453  INR 1.01   CBC: Recent Labs  Lab 12/06/17 1453 12/07/17 0457  WBC 3.3* 2.7*  NEUTROABS 1.6*  --   HGB 12.4* 10.6*  HCT 38.4* 34.2*  MCV 82.2 83.4  PLT 190 171   Cardiac Enzymes: Recent Labs  Lab 12/06/17 1453  CKTOTAL 521*  TROPONINI <0.03   BNP: Invalid input(s): POCBNP CBG: Recent Labs  Lab 12/06/17 1431 12/06/17 1457  GLUCAP 161* 167*   HbA1C: No results for input(s): HGBA1C in the last 72 hours. Urine analysis:    Component Value Date/Time   COLORURINE YELLOW 12/06/2017 1426   APPEARANCEUR CLEAR 12/06/2017 1426   LABSPEC 1.020 12/06/2017 1426   PHURINE 5.0 12/06/2017 1426   GLUCOSEU NEGATIVE 12/06/2017 1426   HGBUR SMALL (A) 12/06/2017 1426   BILIRUBINUR NEGATIVE 12/06/2017 1426   KETONESUR NEGATIVE 12/06/2017 1426   PROTEINUR 100 (A) 12/06/2017 1426   UROBILINOGEN 0.2 12/19/2010 0935   NITRITE NEGATIVE 12/06/2017 1426   LEUKOCYTESUR NEGATIVE 12/06/2017 1426   Sepsis Labs: @LABRCNTIP (procalcitonin:4,lacticidven:4) )No results found for this or any previous visit (from the past 240 hour(s)).   Scheduled Meds: Continuous Infusions: . sodium chloride 100 mL/hr at 12/07/17 0522    Procedures/Studies: Dg Chest 2  View  Result Date: 12/06/2017 CLINICAL DATA:  Weakness EXAM: CHEST - 2 VIEW COMPARISON:  None. FINDINGS: The heart size and mediastinal contours are within normal limits. Both lungs are clear. The visualized skeletal structures are unremarkable. IMPRESSION: No active cardiopulmonary disease. Electronically Signed   By: Ulyses Jarred M.D.   On: 12/06/2017 15:26   Ct Head Wo Contrast  Result Date: 12/06/2017 CLINICAL DATA:  Altered mental status EXAM: CT HEAD WITHOUT CONTRAST TECHNIQUE: Contiguous axial images were obtained from the base of the skull through the vertex without intravenous contrast. COMPARISON:  None. FINDINGS: Brain: No evidence of acute infarction, hemorrhage, hydrocephalus, extra-axial collection or mass lesion/mass effect. Vascular: No hyperdense vessel or unexpected calcification. Skull: No osseous abnormality. Sinuses/Orbits: Visualized paranasal sinuses are clear. Visualized mastoid sinuses are clear. Visualized orbits demonstrate no focal abnormality. Other: None IMPRESSION: No acute intracranial pathology. Electronically Signed   By: Kathreen Devoid   On: 12/06/2017 15:38   Dg Abd 2 Views  Result Date: 12/06/2017 CLINICAL DATA:  Generalized weakness and intermittent abdominal cramping for 2 days. EXAM: ABDOMEN - 2 VIEW COMPARISON:  None. FINDINGS: The  bowel gas pattern is normal. There is no evidence of free air. No radio-opaque calculi or other significant radiographic abnormality is seen. Mild convex right lumbar scoliosis and multilevel degenerative change noted. IMPRESSION: No acute abnormality. Electronically Signed   By: Inge Rise M.D.   On: 12/06/2017 15:27    Orson Eva, DO  [facility] Pager [phone]  If 7PM-7AM, please contact night-coverage [email or web address] Password [email or web address] 12/07/2017, 9:24 AM   LOS: 0 days

## 2017-12-07 NOTE — Care Management Obs Status (Signed)
[place] NOTIFICATION   Patient Details  Name: Tyler Rodgers MRN: [id number] Date of Birth: [birth year]   Medicare Observation Status Notification Given:  Yes    Sherald Barge, RN 12/07/2017, 10:55 AM

## 2017-12-08 ENCOUNTER — Observation Stay (HOSPITAL_COMMUNITY): Payer: Medicare Other

## 2017-12-08 DIAGNOSIS — M6282 Rhabdomyolysis: Secondary | ICD-10-CM | POA: Diagnosis not present

## 2017-12-08 DIAGNOSIS — R748 Abnormal levels of other serum enzymes: Secondary | ICD-10-CM | POA: Diagnosis not present

## 2017-12-08 DIAGNOSIS — G9341 Metabolic encephalopathy: Secondary | ICD-10-CM | POA: Diagnosis not present

## 2017-12-08 DIAGNOSIS — N179 Acute kidney failure, unspecified: Secondary | ICD-10-CM | POA: Diagnosis not present

## 2017-12-08 LAB — CBC
HCT: 37.8 % — ABNORMAL LOW (ref 39.0–52.0)
Hemoglobin: 11.5 g/dL — ABNORMAL LOW (ref 13.0–17.0)
MCH: 25.7 pg — AB (ref 26.0–34.0)
MCHC: 30.4 g/dL (ref 30.0–36.0)
MCV: 84.4 fL (ref 78.0–100.0)
Platelets: 170 10*3/uL (ref 150–400)
RBC: 4.48 MIL/uL (ref 4.22–5.81)
RDW: 16.1 % — AB (ref 11.5–15.5)
WBC: 3.9 10*3/uL — AB (ref 4.0–10.5)

## 2017-12-08 LAB — BASIC METABOLIC PANEL
Anion gap: 7 (ref 5–15)
BUN: 25 mg/dL — AB (ref 8–23)
CO2: 21 mmol/L — AB (ref 22–32)
Calcium: 7.9 mg/dL — ABNORMAL LOW (ref 8.9–10.3)
Chloride: 110 mmol/L (ref 98–111)
Creatinine, Ser: 1.49 mg/dL — ABNORMAL HIGH (ref 0.61–1.24)
GFR calc Af Amer: 50 mL/min — ABNORMAL LOW (ref >60)
GFR calc non Af Amer: 43 mL/min — ABNORMAL LOW (ref >60)
Glucose, Bld: 112 mg/dL — ABNORMAL HIGH (ref 70–99)
POTASSIUM: 4.4 mmol/L (ref 3.5–5.1)
SODIUM: 138 mmol/L (ref 135–145)

## 2017-12-08 LAB — HEPATITIS C ANTIBODY

## 2017-12-08 LAB — IRON AND TIBC
Iron: 78 ug/dL (ref 45–182)
Saturation Ratios: 22 % (ref 17.9–39.5)
TIBC: 363 ug/dL (ref 250–450)
UIBC: 285 ug/dL

## 2017-12-08 LAB — CK: Total CK: 346 U/L (ref 49–397)

## 2017-12-08 LAB — T4, FREE: FREE T4: 1.07 ng/dL (ref 0.82–1.77)

## 2017-12-08 LAB — HEPATITIS B SURFACE ANTIGEN: Hepatitis B Surface Ag: NEGATIVE

## 2017-12-08 LAB — FERRITIN: FERRITIN: 77 ng/mL (ref 24–336)

## 2017-12-08 MED ORDER — ALPRAZOLAM 0.25 MG PO TABS
0.2500 mg | ORAL_TABLET | Freq: Once | ORAL | Status: AC
  Administered 2017-12-08: 0.25 mg via ORAL
  Filled 2017-12-08: qty 1

## 2017-12-08 NOTE — Care Management Note (Signed)
Case Management Note  Patient Details  Name: Tyler Rodgers MRN: [id number] Date of Birth: 17-Apr-1941  Subjective/Objective: AKI. From home with wife. Reports ind with ADL's. Reports no use of DME. Recommended for [facility] PT and cane. Patient declines both. His PCP is [place] prefers to see patient's soon after DC for hospital f/u and assess for home health needs prior to them signing home health orders.                    Action/Plan:  DC home with self care. Patient informed on what home health PT would involve and declines.     Expected Discharge Date:  12/07/17               Expected Discharge Plan:  [place]  In-House Referral:     Discharge planning Services  CM Consult  Post Acute Care Choice:  Home Health Choice offered to:     DME Arranged:    DME Agency:     HH Arranged:  Patient Refused [place] Agency:     Status of Service:  Completed, signed off  If discussed at [employer] of Stay Meetings, dates discussed:    Additional Comments:  Tyler Rodgers, Chauncey Reading, RN 12/08/2017, 1:47 PM

## 2017-12-08 NOTE — Progress Notes (Signed)
PROGRESS NOTE  DERRELL MILANES JJH:[id number] DOB: 1940-12-11 DOA: 12/06/2017 PCP: [facility]  Brief History:  77 year old male with a history of diabetes mellitus, GERD presenting with 2 to 3-day history of increasing generalized weakness, lethargy, and muscle cramping.  Unfortunately, the patient is a poor historian.  His history is supplemented by the patient's wife and daughter at the bedside.  The patient was recently started on Pletal on 12/02/2017.  He denies any other over-the-counter medications besides multivitamin.  The patient has also had worsening unsteady gait for the past 1 to 2 weeks.  His family also notes increasing tremors for the past 1 to 2 weeks.  He denies any chest pain, shortness breath, nausea, vomiting, diarrhea, headaches, visual disturbance.  His family states that he has had unsteady gait with tremulousness for nearly 1 year, but this has been worse over the past 1 week.  Upon presentation, the patient was noted to have a serum creatinine of 2.71.  CPK was 521.  Urinalysis was negative for pyuria.  CT the brain was unremarkable.  Because of his acute kidney injury and tremors, the patient was admitted for further evaluation.  Assessment/Plan: Acute kidney injury -Secondary to volume depletion and a degree of rhabdomyolysis -Abdominal ultrasound--neg hydronephrosis; fatty liver -Urinalysis is bland -Continue IV fluids  Rhabdomyolysis, nontraumatic -CPK 521>>>346 -Continue IV fluids -Check urine drug screen--neg  Hyponatremia -Secondary to volume depletion -Improving with IV fluids  Acute metabolic encephalopathy -Secondary to acute kidney injury -TSH--1.036 -Serum Y18--HUDJSHF -Folic WYOV--78 -Ammonia 32 -Urine drug screen--neg  Gait instability/tremor -Concern the patient may have essential tremor versus other neurodegenerative disease such as Parkinson's -out patient referral to movement disorder specialist -MRI  brain  Diabetes mellitus type 2 -Holding metformin -NovoLog sliding scale -Hemoglobin A1c--7.4  Transaminasemia -Abd US--fatty liver -hep b surface antigen-neg -hep C antibodyneg  Tobacco abuse -I have discussed tobacco cessation with the patient.  I have counseled the patient regarding the negative impacts of continued tobacco use including but not limited to lung cancer, COPD, and cardiovascular disease.  I have discussed alternatives to tobacco and modalities that may help facilitate tobacco cessation including but not limited to biofeedback, hypnosis, and medications.  Total time spent with tobacco counseling was 4 minutes. -start bronchodilators     Disposition Plan:   Home 7/6 Family Communication:  Spouse updated at bedside 7/5   Consultants:  none Code Status:  FULL   DVT Prophylaxis:  SCDs   Procedures: As Listed in Progress Note Above  Antibiotics: None      Subjective: Patient denies fevers, chills, headache, chest pain, dyspnea, nausea, vomiting, diarrhea, abdominal pain, dysuria, hematuria, hematochezia, and melena.   Objective: Vitals:   12/07/17 1540 12/07/17 2115 12/08/17 0544 12/08/17 1509  BP: 110/62 122/68 129/64 (!) 113/57  Pulse: 66 63 (!) 59 (!) 56  Resp: 18   18  Temp: 98.2 F (36.8 C) 98.5 F (36.9 C) 98.3 F (36.8 C) 98 F (36.7 C)  TempSrc:  Oral Oral Oral  SpO2: 97% 98% 100% 100%  Weight:      Height:        Intake/Output Summary (Last 24 hours) at 12/08/2017 1741 Last data filed at 12/08/2017 1300 Gross per 24 hour  Intake 480 ml  Output 300 ml  Net 180 ml   Weight change:  Exam:   General:  Pt is alert, follows commands appropriately, not in acute distress  HEENT: No  icterus, No thrush, No neck mass, [place]/AT  Cardiovascular: RRR, S1/S2, no rubs, no gallops  Respiratory: CTA bilaterally, no wheezing, no crackles, no rhonchi  Abdomen: bibasilar crackles, no wheeze  Extremities: No edema, No  lymphangitis, No petechiae, No rashes, no synovitis   Data Reviewed: I have personally reviewed following labs and imaging studies Basic Metabolic Panel: Recent Labs  Lab 12/06/17 1453 12/07/17 0457 12/08/17 0634  NA 131* 134* 138  K 4.9 4.9 4.4  CL 101 107 110  CO2 20* 20* 21*  GLUCOSE 157* 118* 112*  BUN 49* 38* 25*  CREATININE 2.71* 1.99* 1.49*  CALCIUM 8.1* 7.5* 7.9*   Liver Function Tests: Recent Labs  Lab 12/06/17 1453  AST 56*  ALT 53*  ALKPHOS 73  BILITOT 1.0  PROT 7.4  ALBUMIN 3.8   No results for input(s): LIPASE, AMYLASE in the last 168 hours. Recent Labs  Lab 12/06/17 1556  AMMONIA 32   Coagulation Profile: Recent Labs  Lab 12/06/17 1453  INR 1.01   CBC: Recent Labs  Lab 12/06/17 1453 12/07/17 0457 12/08/17 0634  WBC 3.3* 2.7* 3.9*  NEUTROABS 1.6*  --   --   HGB 12.4* 10.6* 11.5*  HCT 38.4* 34.2* 37.8*  MCV 82.2 83.4 84.4  PLT 190 171 170   Cardiac Enzymes: Recent Labs  Lab 12/06/17 1453 12/08/17 0634  CKTOTAL 521* 346  TROPONINI <0.03  --    BNP: Invalid input(s): POCBNP CBG: Recent Labs  Lab 12/06/17 1431 12/06/17 1457  GLUCAP 161* 167*   HbA1C: Recent Labs    12/07/17 0457  HGBA1C 7.4*   Urine analysis:    Component Value Date/Time   COLORURINE YELLOW 12/06/2017 1426   APPEARANCEUR CLEAR 12/06/2017 1426   LABSPEC 1.020 12/06/2017 1426   PHURINE 5.0 12/06/2017 1426   GLUCOSEU NEGATIVE 12/06/2017 1426   HGBUR SMALL (A) 12/06/2017 1426   BILIRUBINUR NEGATIVE 12/06/2017 1426   KETONESUR NEGATIVE 12/06/2017 1426   PROTEINUR 100 (A) 12/06/2017 1426   UROBILINOGEN 0.2 12/19/2010 0935   NITRITE NEGATIVE 12/06/2017 1426   LEUKOCYTESUR NEGATIVE 12/06/2017 1426   Sepsis Labs: @LABRCNTIP (procalcitonin:4,lacticidven:4) ) Recent Results (from the past 240 hour(s))  Urine culture     Status: Abnormal (Preliminary result)   Collection Time: 12/06/17  2:50 PM  Result Value Ref Range Status   Specimen Description    Final    URINE, RANDOM Performed at [facility], [address] [place], [place] [address]    Special Requests   Final    NONE Performed at [facility], [address], [place],  [address]    Culture 20,000 COLONIES/mL PSEUDOMONAS AERUGINOSA (A)  Final   Report Status PENDING  Incomplete     Scheduled Meds: Continuous Infusions: . sodium chloride 100 mL/hr at 12/08/17 1506    Procedures/Studies: Dg Chest 2 View  Result Date: 12/06/2017 CLINICAL DATA:  Weakness EXAM: CHEST - 2 VIEW COMPARISON:  None. FINDINGS: The heart size and mediastinal contours are within normal limits. Both lungs are clear. The visualized skeletal structures are unremarkable. IMPRESSION: No active cardiopulmonary disease. Electronically Signed   By: Ulyses Jarred M.D.   On: 12/06/2017 15:26   Ct Head Wo Contrast  Result Date: 12/06/2017 CLINICAL DATA:  Altered mental status EXAM: CT HEAD WITHOUT CONTRAST TECHNIQUE: Contiguous axial images were obtained from the base of the skull through the vertex without intravenous contrast. COMPARISON:  None. FINDINGS: Brain: No evidence of acute infarction, hemorrhage, hydrocephalus, extra-axial collection or mass lesion/mass effect. Vascular:  No hyperdense vessel or unexpected calcification. Skull: No osseous abnormality. Sinuses/Orbits: Visualized paranasal sinuses are clear. Visualized mastoid sinuses are clear. Visualized orbits demonstrate no focal abnormality. Other: None IMPRESSION: No acute intracranial pathology. Electronically Signed   By: Kathreen Devoid   On: 12/06/2017 15:38   Mr Brain Wo Contrast  Result Date: 12/08/2017 CLINICAL DATA:  Subacute neuro deficits.  Speech difficulty. EXAM: MRI HEAD WITHOUT CONTRAST TECHNIQUE: Multiplanar, multiecho pulse sequences of the brain and surrounding structures were obtained without intravenous contrast. COMPARISON:  Head CT from 2 days ago FINDINGS: Brain: No acute infarction, hemorrhage, hydrocephalus, extra-axial  collection or mass lesion. Dilated perivascular space below the right basal ganglia. Mild for age cerebral volume loss. Minimal white matter FLAIR hyperintensity. Vascular: Major flow voids are preserved Skull and upper cervical spine: Negative for marrow lesion Sinuses/Orbits: Negative IMPRESSION: Age normal brain MRI. Electronically Signed   By: Monte Fantasia M.D.   On: 12/08/2017 10:58   US Abdomen Complete  Result Date: 12/07/2017 CLINICAL DATA:  Acute kidney injury.  Transaminitis EXAM: ABDOMEN ULTRASOUND COMPLETE COMPARISON:  None. FINDINGS: Gallbladder: Prior cholecystectomy Common bile duct: Diameter: Normal caliber, 6 mm Liver: Increased echotexture compatible with fatty infiltration. No focal abnormality or biliary ductal dilatation. Portal vein is patent on color Doppler imaging with normal direction of blood flow towards the liver. IVC: No abnormality visualized. Pancreas: Visualized portion unremarkable. Spleen: Size and appearance within normal limits. Right Kidney: Length: 9.8 cm. Echogenicity within normal limits. No mass or hydronephrosis visualized. Left Kidney: Length: 10.9 cm. Normal echotexture. No hydronephrosis. 3.5 cm upper pole cyst Abdominal aorta: No aneurysm visualized. Other findings: None. IMPRESSION: Fatty infiltration of the liver. Prior cholecystectomy. 3.5 cm exophytic cyst off the upper pole of the left kidney. No hydronephrosis or acute findings. Electronically Signed   By: Rolm Baptise M.D.   On: 12/07/2017 13:43   Dg Abd 2 Views  Result Date: 12/06/2017 CLINICAL DATA:  Generalized weakness and intermittent abdominal cramping for 2 days. EXAM: ABDOMEN - 2 VIEW COMPARISON:  None. FINDINGS: The bowel gas pattern is normal. There is no evidence of free air. No radio-opaque calculi or other significant radiographic abnormality is seen. Mild convex right lumbar scoliosis and multilevel degenerative change noted. IMPRESSION: No acute abnormality. Electronically Signed   By:  Inge Rise M.D.   On: 12/06/2017 15:27    Orson Eva, DO  [facility] Pager [phone]  If 7PM-7AM, please contact night-coverage [email or web address] Password [email or web address] 12/08/2017, 5:41 PM   LOS: 0 days

## 2017-12-08 NOTE — Evaluation (Signed)
Physical Therapy Evaluation Patient Details Name: Tyler Rodgers MRN: [id number] DOB: 03-03-1941 Today's Date: 12/08/2017   History of Present Illness  Patient is a 77 y.o. male admitted  for observation 12/06/2017 with diagnosis AKI and bilateral leg weakness. PMH: DM2, rhabdomyolysis, acute metabolic encephalopathy, GERD.    Clinical Impression  Patient evaluated by Physical Therapy with no further acute PT needs identified. All education has been completed and the patient has no further questions.Patient overall has good mobility skills, however, he does exhibit balance deficits indicating a higher risk of falls without the use of an AD. Patient would benefit from balance and gait training with a SPC in his home environment to reduce his fall risk. Patient would benefit from ambulation in hallways with nursing using the IV pole of 1 hand held assist for safety.  See below for any follow-up Physical Therapy or equipment needs. PT is signing off. Thank you for this referral.     Follow Up Recommendations Home health PT;Supervision for mobility/OOB    Equipment Recommendations  Cane    Recommendations for Other Services       Precautions / Restrictions Precautions Precautions: Fall Restrictions Weight Bearing Restrictions: No      Mobility  Bed Mobility Overal bed mobility: Independent                Transfers Overall transfer level: Needs assistance   Transfers: Sit to/from Stand;Stand Pivot Transfers Sit to Stand: Min guard Stand pivot transfers: Min guard          Ambulation/Gait Ambulation/Gait assistance: Min guard Gait Distance (Feet): 450 Feet Assistive device: IV Pole;None Gait Pattern/deviations: Step-through pattern     General Gait Details: ambualtion with IV pole - no LOBs; ambulation without AD - 1 minor LOB correcting independently.   Stairs            Wheelchair Mobility    Modified Rankin (Stroke Patients Only)       Balance  Overall balance assessment: Mild deficits observed, not formally tested                                           Pertinent Vitals/Pain Pain Assessment: No/denies pain    Home Living Family/patient expects to be discharged to:: Private residence Living Arrangements: Spouse/significant other Available Help at Discharge: Family Type of Home: House Home Access: Stairs to enter Entrance Stairs-Rails: Left Entrance Stairs-Number of Steps: 4 Home Layout: One level Home Equipment: None      Prior Function Level of Independence: Independent         Comments: no family members present to verify information. Chart review - "poor historian". Patient did report 2 falls this winter in the yard.      Hand Dominance        Extremity/Trunk Assessment   Upper Extremity Assessment Upper Extremity Assessment: Overall WFL for tasks assessed    Lower Extremity Assessment Lower Extremity Assessment: Overall WFL for tasks assessed    Cervical / Trunk Assessment Cervical / Trunk Assessment: Normal  Communication   Communication: No difficulties  Cognition Arousal/Alertness: Awake/alert   Overall Cognitive Status: No family/caregiver present to determine baseline cognitive functioning  General Comments      Exercises     Assessment/Plan    PT Assessment All further PT needs can be met in the next venue of care  PT Problem List Decreased balance;Decreased mobility;Decreased safety awareness       PT Treatment Interventions      PT Goals (Current goals can be found in the Care Plan section)  Acute Rehab PT Goals Patient Stated Goal: go home PT Goal Formulation: With patient Time For Goal Achievement: 12/15/17 Potential to Achieve Goals: Good    Frequency     Barriers to discharge        Co-evaluation               AM-PAC PT "6 Clicks" Daily Activity  Outcome Measure Difficulty  turning over in bed (including adjusting bedclothes, sheets and blankets)?: None Difficulty moving from lying on back to sitting on the side of the bed? : None Difficulty sitting down on and standing up from a chair with arms (e.g., wheelchair, bedside commode, etc,.)?: A Little Help needed moving to and from a bed to chair (including a wheelchair)?: A Little Help needed walking in hospital room?: A Little Help needed climbing 3-5 steps with a railing? : A Little 6 Click Score: 20    End of Session Equipment Utilized During Treatment: Gait belt Activity Tolerance: Patient tolerated treatment well Patient left: in chair;with call bell/phone within reach Nurse Communication: Mobility status PT Visit Diagnosis: Unsteadiness on feet (R26.81);History of falling (Z91.81)    Time: 3887-1959 PT Time Calculation (min) (ACUTE ONLY): 25 min   Charges:   PT Evaluation $PT Eval Low Complexity: 1 Low PT Treatments $Gait Training: 8-22 mins   PT G Codes:        Derk Doubek D. Hartnett-Rands, MS, PT [facility] [place] #[id number] 12/08/2017, 12:34 PM

## 2017-12-09 DIAGNOSIS — Z7984 Long term (current) use of oral hypoglycemic drugs: Secondary | ICD-10-CM | POA: Diagnosis not present

## 2017-12-09 DIAGNOSIS — R740 Nonspecific elevation of levels of transaminase and lactic acid dehydrogenase [LDH]: Secondary | ICD-10-CM | POA: Diagnosis not present

## 2017-12-09 DIAGNOSIS — Z79899 Other long term (current) drug therapy: Secondary | ICD-10-CM | POA: Diagnosis not present

## 2017-12-09 DIAGNOSIS — E871 Hypo-osmolality and hyponatremia: Secondary | ICD-10-CM | POA: Diagnosis present

## 2017-12-09 DIAGNOSIS — E119 Type 2 diabetes mellitus without complications: Secondary | ICD-10-CM | POA: Diagnosis present

## 2017-12-09 DIAGNOSIS — N179 Acute kidney failure, unspecified: Secondary | ICD-10-CM | POA: Diagnosis present

## 2017-12-09 DIAGNOSIS — R251 Tremor, unspecified: Secondary | ICD-10-CM

## 2017-12-09 DIAGNOSIS — G9341 Metabolic encephalopathy: Secondary | ICD-10-CM | POA: Diagnosis present

## 2017-12-09 DIAGNOSIS — R066 Hiccough: Secondary | ICD-10-CM | POA: Diagnosis present

## 2017-12-09 DIAGNOSIS — F17210 Nicotine dependence, cigarettes, uncomplicated: Secondary | ICD-10-CM | POA: Diagnosis present

## 2017-12-09 DIAGNOSIS — M6282 Rhabdomyolysis: Secondary | ICD-10-CM | POA: Diagnosis present

## 2017-12-09 DIAGNOSIS — K760 Fatty (change of) liver, not elsewhere classified: Secondary | ICD-10-CM | POA: Diagnosis present

## 2017-12-09 DIAGNOSIS — E860 Dehydration: Secondary | ICD-10-CM | POA: Diagnosis present

## 2017-12-09 DIAGNOSIS — K219 Gastro-esophageal reflux disease without esophagitis: Secondary | ICD-10-CM | POA: Diagnosis present

## 2017-12-09 DIAGNOSIS — R748 Abnormal levels of other serum enzymes: Secondary | ICD-10-CM | POA: Diagnosis not present

## 2017-12-09 LAB — BASIC METABOLIC PANEL
ANION GAP: 6 (ref 5–15)
BUN: 16 mg/dL (ref 8–23)
CALCIUM: 8 mg/dL — AB (ref 8.9–10.3)
CO2: 23 mmol/L (ref 22–32)
Chloride: 108 mmol/L (ref 98–111)
Creatinine, Ser: 1.29 mg/dL — ABNORMAL HIGH (ref 0.61–1.24)
GFR, EST AFRICAN AMERICAN: 59 mL/min — AB (ref >60)
GFR, EST NON AFRICAN AMERICAN: 51 mL/min — AB (ref >60)
Glucose, Bld: 126 mg/dL — ABNORMAL HIGH (ref 70–99)
POTASSIUM: 4.5 mmol/L (ref 3.5–5.1)
Sodium: 137 mmol/L (ref 135–145)

## 2017-12-09 LAB — URINE CULTURE: Culture: 20000 — AB

## 2017-12-09 MED ORDER — GLIPIZIDE 5 MG PO TABS: 2.5000 mg | ORAL_TABLET | Freq: Every day | ORAL | 1 refills | Status: DC

## 2017-12-09 NOTE — Progress Notes (Signed)
Patient discharged home today per MD orders. Patient vital signs WDL. IV removed and site WDL. Discharge Instructions including follow up appointments, medications, and education reviewed with patient. Patient continuing to refuse home health at this time. Patient verbalizes understanding of all material covered and expresses he has no questions at this time. Patient is transported out via wheelchair.

## 2017-12-09 NOTE — Discharge Summary (Addendum)
Physician Discharge Summary  Tyler Rodgers FWY:[id number] DOB: June 20, 1940 DOA: 12/06/2017  PCP: [facility]  Admit date: 12/06/2017 Discharge date: 12/09/2017  Admitted From: Home Disposition:  Home   Recommendations for Outpatient Follow-up:  1. Follow up with PCP in 1-2 weeks 2. Please obtain BMP/CBC in one week   Home Health: YES Equipment/Devices: HHPT, cane  Discharge Condition: Stable CODE STATUS: FULL Diet recommendation: Heart Healthy / Carb Modified    Brief/Interim Summary: 77 year old male with a history of diabetes mellitus, GERD presenting with 2 to 3-day history of increasing generalized weakness, lethargy, and muscle cramping. Unfortunately, the patient is a poor historian. His history is supplemented by the patient's wife and daughter at the bedside. The patient was recently started on Pletal on 12/02/2017. He denies any other over-the-counter medications besides multivitamin. The patient has also had worsening unsteady gait for the past 1 to 2 weeks. His family also notes increasing tremors for the past 1 to 2 weeks. He denies any chest pain, shortness breath, nausea, vomiting, diarrhea, headaches, visual disturbance. His family states that he has had unsteady gait with tremulousness for nearly 1 year,but this has been worse over the past 1 week. Upon presentation, the patient was noted to have a serum creatinine of 2.71. CPK was 521. Urinalysis was negative for pyuria. CT the brain was unremarkable. Because of his acute kidney injury and tremors, the patient was admitted for further evaluation.    Discharge Diagnoses:  Acute kidney injury -Secondary to volume depletion and a degree of rhabdomyolysis -Abdominal ultrasound--neg hydronephrosis; fatty liver -Urinalysis is bland -Continued IV fluids -serum creatinine peaked 2.17 -serum creatinine 1.29 on day of d/c -likely has a degree of CKD  Rhabdomyolysis, nontraumatic -CPK  521>>>346 -Continue IV fluids-->improved -Check urine drug screen--neg  Hyponatremia -Secondary to volume depletion -Improved with IV fluids  Acute metabolic encephalopathy -Secondary to acute kidney injury -improved, back to baseline at time of discharge -TSH--1.036 -Serum Y77--AJOINOM -Folic VEHM--09 -Ammonia 32 -Urine drug screen--neg  Gait instability/tremor -Concern the patient may have essential tremor versus other neurodegenerative disease such as Parkinson's -out patient referral to movement disorder specialist -MRI brain--neg for acute findings -PT-->HHPT  Diabetes mellitus type 2 -Holding metformin--will not restart as CrCl ~50-55 -d/c home with glipizide 2.5 mg daily -follow up with PCP to adjust meds -NovoLog sliding scale during the hospitalization -Hemoglobin A1c--7.4  Transaminasemia -Abd US--fatty liver -hep b surface antigen-neg -hep C antibody-neg  Tobacco abuse -I have discussed tobacco cessation with the patient. I have counseled the patient regarding the negative impacts of continued tobacco use including but not limited to lung cancer, COPD, and cardiovascular disease. I have discussed alternatives to tobacco and modalities that may help facilitate tobacco cessation including but not limited to biofeedback, hypnosis, and medications. Total time spent with tobacco counseling was 4 minutes. -start bronchodilators        Discharge Instructions  Discharge Instructions    Ambulatory referral to Neurology   Complete by:  As directed    Refer to [facility]--Refer to Dr. Carles Collet for tremor     Allergies as of 12/09/2017   No Known Allergies     Medication List    STOP taking these medications   metFORMIN 500 MG 24 hr tablet Commonly known as:  GLUCOPHAGE-XR     TAKE these medications   cetirizine 10 MG tablet Commonly known as:  ZYRTEC Take 10 mg by mouth daily.   cilostazol 50 MG tablet Commonly known as:  PLETAL  Take  1 tablet by mouth 2 (two) times daily.   glipiZIDE 5 MG tablet Commonly known as:  GLUCOTROL Take 0.5 tablets (2.5 mg total) by mouth daily before breakfast.   multivitamin with minerals Tabs tablet Take 1 tablet by mouth daily.   Vitamin D (Ergocalciferol) 50000 units Caps capsule Commonly known as:  DRISDOL Take 1 capsule by mouth every 7 (seven) days. Takes on Sundays.       No Known Allergies  Consultations:  none   Procedures/Studies: Dg Chest 2 View  Result Date: 12/06/2017 CLINICAL DATA:  Weakness EXAM: CHEST - 2 VIEW COMPARISON:  None. FINDINGS: The heart size and mediastinal contours are within normal limits. Both lungs are clear. The visualized skeletal structures are unremarkable. IMPRESSION: No active cardiopulmonary disease. Electronically Signed   By: Ulyses Jarred M.D.   On: 12/06/2017 15:26   Ct Head Wo Contrast  Result Date: 12/06/2017 CLINICAL DATA:  Altered mental status EXAM: CT HEAD WITHOUT CONTRAST TECHNIQUE: Contiguous axial images were obtained from the base of the skull through the vertex without intravenous contrast. COMPARISON:  None. FINDINGS: Brain: No evidence of acute infarction, hemorrhage, hydrocephalus, extra-axial collection or mass lesion/mass effect. Vascular: No hyperdense vessel or unexpected calcification. Skull: No osseous abnormality. Sinuses/Orbits: Visualized paranasal sinuses are clear. Visualized mastoid sinuses are clear. Visualized orbits demonstrate no focal abnormality. Other: None IMPRESSION: No acute intracranial pathology. Electronically Signed   By: Kathreen Devoid   On: 12/06/2017 15:38   Mr Brain Wo Contrast  Result Date: 12/08/2017 CLINICAL DATA:  Subacute neuro deficits.  Speech difficulty. EXAM: MRI HEAD WITHOUT CONTRAST TECHNIQUE: Multiplanar, multiecho pulse sequences of the brain and surrounding structures were obtained without intravenous contrast. COMPARISON:  Head CT from 2 days ago FINDINGS: Brain: No acute infarction,  hemorrhage, hydrocephalus, extra-axial collection or mass lesion. Dilated perivascular space below the right basal ganglia. Mild for age cerebral volume loss. Minimal white matter FLAIR hyperintensity. Vascular: Major flow voids are preserved Skull and upper cervical spine: Negative for marrow lesion Sinuses/Orbits: Negative IMPRESSION: Age normal brain MRI. Electronically Signed   By: Monte Fantasia M.D.   On: 12/08/2017 10:58   US Abdomen Complete  Result Date: 12/07/2017 CLINICAL DATA:  Acute kidney injury.  Transaminitis EXAM: ABDOMEN ULTRASOUND COMPLETE COMPARISON:  None. FINDINGS: Gallbladder: Prior cholecystectomy Common bile duct: Diameter: Normal caliber, 6 mm Liver: Increased echotexture compatible with fatty infiltration. No focal abnormality or biliary ductal dilatation. Portal vein is patent on color Doppler imaging with normal direction of blood flow towards the liver. IVC: No abnormality visualized. Pancreas: Visualized portion unremarkable. Spleen: Size and appearance within normal limits. Right Kidney: Length: 9.8 cm. Echogenicity within normal limits. No mass or hydronephrosis visualized. Left Kidney: Length: 10.9 cm. Normal echotexture. No hydronephrosis. 3.5 cm upper pole cyst Abdominal aorta: No aneurysm visualized. Other findings: None. IMPRESSION: Fatty infiltration of the liver. Prior cholecystectomy. 3.5 cm exophytic cyst off the upper pole of the left kidney. No hydronephrosis or acute findings. Electronically Signed   By: Rolm Baptise M.D.   On: 12/07/2017 13:43   Dg Abd 2 Views  Result Date: 12/06/2017 CLINICAL DATA:  Generalized weakness and intermittent abdominal cramping for 2 days. EXAM: ABDOMEN - 2 VIEW COMPARISON:  None. FINDINGS: The bowel gas pattern is normal. There is no evidence of free air. No radio-opaque calculi or other significant radiographic abnormality is seen. Mild convex right lumbar scoliosis and multilevel degenerative change noted. IMPRESSION: No acute  abnormality. Electronically Signed   By: Marcello Moores  Dalessio M.D.   On: 12/06/2017 15:27         Discharge Exam: Vitals:   12/08/17 2130 12/09/17 0543  BP: 109/79 124/77  Pulse:  (!) 55  Resp:  18  Temp:  97.9 F (36.6 C)  SpO2:  98%   Vitals:   12/08/17 1509 12/08/17 2126 12/08/17 2130 12/09/17 0543  BP: (!) 113/57 (!) 84/65 109/79 124/77  Pulse: (!) 56 75  (!) 55  Resp: 18 18  18   Temp: 98 F (36.7 C) 98.1 F (36.7 C)  97.9 F (36.6 C)  TempSrc: Oral Oral  Oral  SpO2: 100% 98%  98%  Weight:      Height:        General: Pt is alert, awake, not in acute distress Cardiovascular: RRR, S1/S2 +, no rubs, no gallops Respiratory: bibasilar rales, no wheeze Abdominal: Soft, NT, ND, bowel sounds + Extremities: no edema, no cyanosis   The results of significant diagnostics from this hospitalization (including imaging, microbiology, ancillary and laboratory) are listed below for reference.    Significant Diagnostic Studies: Dg Chest 2 View  Result Date: 12/06/2017 CLINICAL DATA:  Weakness EXAM: CHEST - 2 VIEW COMPARISON:  None. FINDINGS: The heart size and mediastinal contours are within normal limits. Both lungs are clear. The visualized skeletal structures are unremarkable. IMPRESSION: No active cardiopulmonary disease. Electronically Signed   By: Ulyses Jarred M.D.   On: 12/06/2017 15:26   Ct Head Wo Contrast  Result Date: 12/06/2017 CLINICAL DATA:  Altered mental status EXAM: CT HEAD WITHOUT CONTRAST TECHNIQUE: Contiguous axial images were obtained from the base of the skull through the vertex without intravenous contrast. COMPARISON:  None. FINDINGS: Brain: No evidence of acute infarction, hemorrhage, hydrocephalus, extra-axial collection or mass lesion/mass effect. Vascular: No hyperdense vessel or unexpected calcification. Skull: No osseous abnormality. Sinuses/Orbits: Visualized paranasal sinuses are clear. Visualized mastoid sinuses are clear. Visualized orbits demonstrate  no focal abnormality. Other: None IMPRESSION: No acute intracranial pathology. Electronically Signed   By: Kathreen Devoid   On: 12/06/2017 15:38   Mr Brain Wo Contrast  Result Date: 12/08/2017 CLINICAL DATA:  Subacute neuro deficits.  Speech difficulty. EXAM: MRI HEAD WITHOUT CONTRAST TECHNIQUE: Multiplanar, multiecho pulse sequences of the brain and surrounding structures were obtained without intravenous contrast. COMPARISON:  Head CT from 2 days ago FINDINGS: Brain: No acute infarction, hemorrhage, hydrocephalus, extra-axial collection or mass lesion. Dilated perivascular space below the right basal ganglia. Mild for age cerebral volume loss. Minimal white matter FLAIR hyperintensity. Vascular: Major flow voids are preserved Skull and upper cervical spine: Negative for marrow lesion Sinuses/Orbits: Negative IMPRESSION: Age normal brain MRI. Electronically Signed   By: Monte Fantasia M.D.   On: 12/08/2017 10:58   US Abdomen Complete  Result Date: 12/07/2017 CLINICAL DATA:  Acute kidney injury.  Transaminitis EXAM: ABDOMEN ULTRASOUND COMPLETE COMPARISON:  None. FINDINGS: Gallbladder: Prior cholecystectomy Common bile duct: Diameter: Normal caliber, 6 mm Liver: Increased echotexture compatible with fatty infiltration. No focal abnormality or biliary ductal dilatation. Portal vein is patent on color Doppler imaging with normal direction of blood flow towards the liver. IVC: No abnormality visualized. Pancreas: Visualized portion unremarkable. Spleen: Size and appearance within normal limits. Right Kidney: Length: 9.8 cm. Echogenicity within normal limits. No mass or hydronephrosis visualized. Left Kidney: Length: 10.9 cm. Normal echotexture. No hydronephrosis. 3.5 cm upper pole cyst Abdominal aorta: No aneurysm visualized. Other findings: None. IMPRESSION: Fatty infiltration of the liver. Prior cholecystectomy. 3.5 cm exophytic cyst off the upper  pole of the left kidney. No hydronephrosis or acute findings.  Electronically Signed   By: Rolm Baptise M.D.   On: 12/07/2017 13:43   Dg Abd 2 Views  Result Date: 12/06/2017 CLINICAL DATA:  Generalized weakness and intermittent abdominal cramping for 2 days. EXAM: ABDOMEN - 2 VIEW COMPARISON:  None. FINDINGS: The bowel gas pattern is normal. There is no evidence of free air. No radio-opaque calculi or other significant radiographic abnormality is seen. Mild convex right lumbar scoliosis and multilevel degenerative change noted. IMPRESSION: No acute abnormality. Electronically Signed   By: Inge Rise M.D.   On: 12/06/2017 15:27     Microbiology: Recent Results (from the past 240 hour(s))  Urine culture     Status: Abnormal   Collection Time: 12/06/17  2:50 PM  Result Value Ref Range Status   Specimen Description   Final    URINE, RANDOM Performed at [facility], [address], [place], [place] [address]    Special Requests   Final    NONE Performed at [facility], [address], [place], [place] [address]    Culture 20,000 COLONIES/mL PSEUDOMONAS AERUGINOSA (A)  Final   Report Status 12/09/2017 FINAL  Final   Organism ID, Bacteria PSEUDOMONAS AERUGINOSA (A)  Final      Susceptibility   Pseudomonas aeruginosa - MIC*    CEFTAZIDIME 8 SENSITIVE Sensitive     CIPROFLOXACIN <=0.25 SENSITIVE Sensitive     GENTAMICIN 2 SENSITIVE Sensitive     IMIPENEM 2 SENSITIVE Sensitive     PIP/TAZO 16 SENSITIVE Sensitive     CEFEPIME 4 SENSITIVE Sensitive     * 20,000 COLONIES/mL PSEUDOMONAS AERUGINOSA     Labs: Basic Metabolic Panel: Recent Labs  Lab 12/06/17 1453 12/07/17 0457 12/08/17 0634 12/09/17 0844  NA 131* 134* 138 137  K 4.9 4.9 4.4 4.5  CL 101 107 110 108  CO2 20* 20* 21* 23  GLUCOSE 157* 118* 112* 126*  BUN 49* 38* 25* 16  CREATININE 2.71* 1.99* 1.49* 1.29*  CALCIUM 8.1* 7.5* 7.9* 8.0*   Liver Function Tests: Recent Labs  Lab 12/06/17 1453  AST 56*  ALT 53*  ALKPHOS 73  BILITOT 1.0  PROT 7.4  ALBUMIN 3.8   No results  for input(s): LIPASE, AMYLASE in the last 168 hours. Recent Labs  Lab 12/06/17 1556  AMMONIA 32   CBC: Recent Labs  Lab 12/06/17 1453 12/07/17 0457 12/08/17 0634  WBC 3.3* 2.7* 3.9*  NEUTROABS 1.6*  --   --   HGB 12.4* 10.6* 11.5*  HCT 38.4* 34.2* 37.8*  MCV 82.2 83.4 84.4  PLT 190 171 170   Cardiac Enzymes: Recent Labs  Lab 12/06/17 1453 12/08/17 0634  CKTOTAL 521* 346  TROPONINI <0.03  --    BNP: Invalid input(s): POCBNP CBG: Recent Labs  Lab 12/06/17 1431 12/06/17 1457  GLUCAP 161* 167*    Time coordinating discharge:  36 minutes  Signed:  Orson Eva, DO [facility] Pager: [phone] 12/09/2017, 9:48 AM

## 2017-12-11 ENCOUNTER — Encounter: Payer: Self-pay | Admitting: Neurology

## 2017-12-14 LAB — VITAMIN B12

## 2017-12-18 ENCOUNTER — Ambulatory Visit: Payer: Medicare Other | Admitting: Neurology

## 2017-12-18 ENCOUNTER — Telehealth: Payer: Self-pay | Admitting: Neurology

## 2017-12-18 NOTE — Telephone Encounter (Signed)
It is doubtful I will be able to write this as doesn't have cognitive testing scheduled.  Go ahead and do [facility] day of visit but not sure we will be able to write.

## 2017-12-18 NOTE — Telephone Encounter (Signed)
Patient's wife called and his first appointment will be on Wednesday 12/20/2017. She is needing a letter from Dr. Carles Collet with 2 witnesses on it and she needs it to say That he is giving her permission to sign for him since he is unable to now. She is needing to take this to the bank regarding taking care of bills.She would like to speak with someone by herself.   Please Advise. Thanks

## 2017-12-18 NOTE — Progress Notes (Deleted)
Tyler Rodgers was seen today in the movement disorders clinic for neurologic consultation at the request of Dewanda Fennema, Shanon Brow, MD.  The consultation is for the evaluation of tremor.  The records that were made available to me were reviewed.  Patient was recently in the hospital for acute metabolic encephalopathy secondary to acute renal injury.  At the time of discharge, these things were back to baseline.  While in the hospital, tremor was noted with the concern for possible Parkinson's disease.  He was referred here for evaluation.  Records made available to me are reviewed.  Tremor: {yes no:314532}   How long has it been going on? ***  At rest or with activation?  ***  When is it noted the most?  ***  Fam hx of tremor?  {yes UU:725366}  Located where?  ***  Affected by caffeine:  {yes no:314532}  Affected by alcohol:  {yes no:314532}  Affected by stress:  {yes no:314532}  Affected by fatigue:  {yes no:314532}  Spills soup if on spoon:  {yes no:314532}  Spills glass of liquid if full:  {yes no:314532}  Affects ADL's (tying shoes, brushing teeth, etc):  {yes no:314532}  Tremor inducing meds:  {yes no:314532}  Other Specific Symptoms:  Voice: *** Sleep: ***  Vivid Dreams:  {yes no:314532}  Acting out dreams:  {yes no:314532} Wet Pillows: {yes no:314532} Postural symptoms:  {yes no:314532}  Falls?  {yes no:314532} Bradykinesia symptoms: {parkinson brady:18041} Loss of smell:  {yes no:314532} Loss of taste:  {yes no:314532} Urinary Incontinence:  {yes no:314532} Difficulty Swallowing:  {yes no:314532} Handwriting, micrographia: {yes no:314532} Trouble with ADL's:  {yes no:314532}  Trouble buttoning clothing: {yes no:314532} Depression:  {yes no:314532} Memory changes:  {yes no:314532} Hallucinations:  {yes no:314532}  visual distortions: {yes no:314532} N/V:  {yes no:314532} Lightheaded:  {yes no:314532}  Syncope: {yes no:314532} Diplopia:  {yes no:314532} Dyskinesia:  {yes  no:314532}  Neuroimaging of the brain has *** previously been performed.  It *** available for my review today.  MRI of the brain was completed on December 08, 2017.  I personally reviewed this.  There was very few T2 hyperintensities.  There is mild atrophy.  PREVIOUS MEDICATIONS: {Parkinson's RX:18200}  ALLERGIES:  No Known Allergies  CURRENT MEDICATIONS:  Outpatient Encounter Medications as of 12/20/2017  Medication Sig  . cetirizine (ZYRTEC) 10 MG tablet Take 10 mg by mouth daily.  . cilostazol (PLETAL) 50 MG tablet Take 1 tablet by mouth 2 (two) times daily.  Marland Kitchen glipiZIDE (GLUCOTROL) 5 MG tablet Take 0.5 tablets (2.5 mg total) by mouth daily before breakfast.  . Multiple Vitamin (MULTIVITAMIN WITH MINERALS) TABS tablet Take 1 tablet by mouth daily.  . Vitamin D, Ergocalciferol, (DRISDOL) 50000 units CAPS capsule Take 1 capsule by mouth every 7 (seven) days. Takes on Sundays.   No facility-administered encounter medications on file as of 12/20/2017.     PAST MEDICAL HISTORY:   Past Medical History:  Diagnosis Date  . Anxiety   . Diabetes mellitus without complication ([place])   . GERD (gastroesophageal reflux disease)   . Gout   . Kidney stone     PAST SURGICAL HISTORY:   Past Surgical History:  Procedure Laterality Date  . CHOLECYSTECTOMY      SOCIAL HISTORY:   Social History   Socioeconomic History  . Marital status: Married    Spouse name: Not on file  . Number of children: Not on file  . Years of education: Not on file  . Highest education  level: Not on file  Occupational History  . Not on file  Social Needs  . Financial resource strain: Not on file  . Food insecurity:    Worry: Not on file    Inability: Not on file  . Transportation needs:    Medical: Not on file    Non-medical: Not on file  Tobacco Use  . Smoking status: Current Every Day Smoker    Packs/day: 0.50  . Smokeless tobacco: Never Used  Substance and Sexual Activity  . Alcohol use: Not Currently    . Drug use: Not on file  . Sexual activity: Not on file  Lifestyle  . Physical activity:    Days per week: Not on file    Minutes per session: Not on file  . Stress: Not on file  Relationships  . Social connections:    Talks on phone: Not on file    Gets together: Not on file    Attends religious service: Not on file    Active member of club or organization: Not on file    Attends meetings of clubs or organizations: Not on file    Relationship status: Not on file  . Intimate partner violence:    Fear of current or ex partner: Not on file    Emotionally abused: Not on file    Physically abused: Not on file    Forced sexual activity: Not on file  Other Topics Concern  . Not on file  Social History Narrative  . Not on file    FAMILY HISTORY:   No family status information on file.    ROS:  ROS  PHYSICAL EXAMINATION:    VITALS:  There were no vitals filed for this visit.  GEN:  The patient appears stated age and is in NAD. HEENT:  Normocephalic, atraumatic.  The mucous membranes are moist. The superficial temporal arteries are without ropiness or tenderness. CV:  RRR Lungs:  CTAB Neck/HEME:  There are no carotid bruits bilaterally.  Neurological examination:  Orientation: The patient is alert and oriented x3. Fund of knowledge is appropriate.  Recent and remote memory are intact.  Attention and concentration are normal.    Able to name objects and repeat phrases. Cranial nerves: There is good facial symmetry. Pupils are equal round and reactive to light bilaterally. Fundoscopic exam reveals clear margins bilaterally. Extraocular muscles are intact. The visual fields are full to confrontational testing. The speech is fluent and clear. Soft palate rises symmetrically and there is no tongue deviation. Hearing is intact to conversational tone. Sensation: Sensation is intact to light and pinprick throughout (facial, trunk, extremities). Vibration is intact at the bilateral big  toe. There is no extinction with double simultaneous stimulation. There is no sensory dermatomal level identified. Motor: Strength is 5/5 in the bilateral upper and lower extremities.   Shoulder shrug is equal and symmetric.  There is no pronator drift. Deep tendon reflexes: Deep tendon reflexes are 2/4 at the bilateral biceps, triceps, brachioradialis, patella and achilles. Plantar responses are downgoing bilaterally.  Movement examination: Tone: There is ***tone in the bilateral upper extremities.  The tone in the lower extremities is ***.  Abnormal movements: *** Coordination:  There is *** decremation with RAM's, *** Gait and Station: The patient has *** difficulty arising out of a deep-seated chair without the use of the hands. The patient's stride length is ***.  The patient has a *** pull test.      Labs: Lab Results  Component Value  Date   TSH 1.036 12/07/2017   Lab Results  Component Value Date   VITAMINB12 SEE SEPARATE REPORT 12/07/2017     ASSESSMENT/PLAN:  ***  Cc:  Duarte, Shirley

## 2017-12-19 NOTE — Telephone Encounter (Signed)
Left message on machine for patient's wife to call back.   

## 2017-12-19 NOTE — Telephone Encounter (Signed)
Patient's wife called back, let her know that they may need to be further cognitive testing before a letter could be wrote.

## 2017-12-20 ENCOUNTER — Ambulatory Visit: Payer: Medicare Other | Admitting: Neurology

## 2018-07-11 ENCOUNTER — Ambulatory Visit (INDEPENDENT_AMBULATORY_CARE_PROVIDER_SITE_OTHER): Payer: Medicare Other | Admitting: Internal Medicine

## 2018-07-17 ENCOUNTER — Telehealth (INDEPENDENT_AMBULATORY_CARE_PROVIDER_SITE_OTHER): Payer: Self-pay | Admitting: *Deleted

## 2018-07-17 ENCOUNTER — Ambulatory Visit (INDEPENDENT_AMBULATORY_CARE_PROVIDER_SITE_OTHER): Payer: Medicare Other | Admitting: Internal Medicine

## 2018-07-17 ENCOUNTER — Encounter (INDEPENDENT_AMBULATORY_CARE_PROVIDER_SITE_OTHER): Payer: Self-pay | Admitting: Internal Medicine

## 2018-07-17 ENCOUNTER — Encounter (INDEPENDENT_AMBULATORY_CARE_PROVIDER_SITE_OTHER): Payer: Self-pay | Admitting: *Deleted

## 2018-07-17 VITALS — BP 143/65 | HR 80 | Temp 98.5°F | Ht 67.0 in | Wt 193.7 lb

## 2018-07-17 DIAGNOSIS — D500 Iron deficiency anemia secondary to blood loss (chronic): Secondary | ICD-10-CM

## 2018-07-17 MED ORDER — PEG 3350-KCL-NA BICARB-NACL 420 G PO SOLR: 4000.0000 mL | Freq: Once | ORAL | 0 refills | Status: AC

## 2018-07-17 NOTE — Patient Instructions (Addendum)
EGD/Colonoscopy

## 2018-07-17 NOTE — Progress Notes (Addendum)
   Subjective:    Patient ID: Tyler Rodgers, male    DOB: June 26, 1940, 78 y.o.   MRN: [id number]  HPI Referred by [facility] for anemia. Denies prior hx of anemia.  Has not had any black stool. Denies seeing any blood in his stools. His appetite is okay. No weight loss. No change in his stools. No family hx of colon cancer that he knows of.  Never had a colonoscopy.  States stopped taking ASA 325mg  daily for 6-8 days ago   06/19/2018 Hemoglobin 8.8  06/04/2018 Iron 89m hemoglobin 8.8.  12/07/2017 Ferritin 77, Iron 78, TIBC 363, Sat 22, UIBC 285. H and H 10.6 and 34.2.   Hx of diabetes 5-6 yrs.    CBC    Component Value Date/Time   WBC 3.9 (L) 12/08/2017 0634   RBC 4.48 12/08/2017 0634   HGB 11.5 (L) 12/08/2017 0634   HCT 37.8 (L) 12/08/2017 0634   PLT 170 12/08/2017 0634   MCV 84.4 12/08/2017 0634   MCH 25.7 (L) 12/08/2017 0634   MCHC 30.4 12/08/2017 0634   RDW 16.1 (H) 12/08/2017 0634   LYMPHSABS 1.0 12/06/2017 1453   MONOABS 0.7 12/06/2017 1453   EOSABS 0.0 12/06/2017 1453   BASOSABS 0.0 12/06/2017 1453       Review of Systems Past Medical History:  Diagnosis Date  . Anxiety   . Diabetes mellitus without complication ([place])   . GERD (gastroesophageal reflux disease)   . Gout   . Kidney stone     Past Surgical History:  Procedure Laterality Date  . CHOLECYSTECTOMY      No Known Allergies  Current Outpatient Medications on File Prior to Visit  Medication Sig Dispense Refill  . cilostazol (PLETAL) 50 MG tablet Take 1 tablet by mouth 2 (two) times daily.  5  . metFORMIN (GLUCOPHAGE) 1000 MG tablet Take 1,000 mg by mouth 2 (two) times daily with a meal. Takes 1/2 tab at night and whole pill in am    . Multiple Vitamin (MULTIVITAMIN WITH MINERALS) TABS tablet Take 1 tablet by mouth daily.    . Vitamin D, Ergocalciferol, (DRISDOL) 50000 units CAPS capsule Take 1 capsule by mouth every 7 (seven) days. Takes on Sundays.  3   No current  facility-administered medications on file prior to visit.         Objective:   Physical Exam Blood pressure (!) 143/65, pulse 80, temperature 98.5 F (36.9 C), height 5\' 7"  (1.702 m), weight 193 lb 11.2 oz (87.9 kg). Alert and oriented. Skin warm and dry. Oral mucosa is moist.   . Sclera anicteric, conjunctivae is pink. Thyroid not enlarged. No cervical lymphadenopathy. Lungs clear. Heart regular rate and rhythm.  Abdomen is soft. Bowel sounds are positive. No hepatomegaly. No abdominal masses felt. No tenderness.  No edema to lower extremities.          Assessment & Plan:  IDA. Am going to get CBC and ferritin today.  EGD/Colonoscopy. Colonic neoplasm, ulcer needs to be ruled out.  3 stool cards home with patient. The risks of bleeding, perforation and infection were reviewed with patient.

## 2018-07-17 NOTE — Telephone Encounter (Signed)
Patient needs trilyte 

## 2018-07-18 ENCOUNTER — Telehealth (INDEPENDENT_AMBULATORY_CARE_PROVIDER_SITE_OTHER): Payer: Self-pay | Admitting: Internal Medicine

## 2018-07-18 LAB — HEMOGLOBIN AND HEMATOCRIT, BLOOD
HEMATOCRIT: 29.3 % — AB (ref 38.5–50.0)
HEMOGLOBIN: 8.8 g/dL — AB (ref 13.2–17.1)

## 2018-07-18 LAB — FERRITIN: FERRITIN: 8 ng/mL — AB (ref 24–380)

## 2018-07-18 NOTE — Telephone Encounter (Signed)
err

## 2018-08-16 ENCOUNTER — Encounter (INDEPENDENT_AMBULATORY_CARE_PROVIDER_SITE_OTHER): Payer: Self-pay | Admitting: *Deleted

## 2018-10-03 ENCOUNTER — Ambulatory Visit (HOSPITAL_COMMUNITY)
Admission: RE | Admit: 2018-10-03 | Discharge: 2018-10-03 | Disposition: A | Discharge status: Home / Self Care | Payer: Medicare Other | Attending: Internal Medicine | Admitting: Internal Medicine

## 2018-10-03 ENCOUNTER — Other Ambulatory Visit: Payer: Self-pay

## 2018-10-03 ENCOUNTER — Encounter (HOSPITAL_COMMUNITY): Payer: Self-pay | Admitting: *Deleted

## 2018-10-03 ENCOUNTER — Encounter (HOSPITAL_COMMUNITY)
Admission: RE | Disposition: A | Discharge status: Home / Self Care | Payer: Self-pay | Source: Home / Self Care | Attending: Internal Medicine

## 2018-10-03 DIAGNOSIS — F17210 Nicotine dependence, cigarettes, uncomplicated: Secondary | ICD-10-CM | POA: Insufficient documentation

## 2018-10-03 DIAGNOSIS — D509 Iron deficiency anemia, unspecified: Secondary | ICD-10-CM

## 2018-10-03 DIAGNOSIS — E119 Type 2 diabetes mellitus without complications: Secondary | ICD-10-CM | POA: Diagnosis not present

## 2018-10-03 DIAGNOSIS — D123 Benign neoplasm of transverse colon: Secondary | ICD-10-CM

## 2018-10-03 DIAGNOSIS — K3189 Other diseases of stomach and duodenum: Secondary | ICD-10-CM | POA: Insufficient documentation

## 2018-10-03 DIAGNOSIS — Z7984 Long term (current) use of oral hypoglycemic drugs: Secondary | ICD-10-CM | POA: Insufficient documentation

## 2018-10-03 DIAGNOSIS — Z7902 Long term (current) use of antithrombotics/antiplatelets: Secondary | ICD-10-CM | POA: Diagnosis not present

## 2018-10-03 DIAGNOSIS — K5730 Diverticulosis of large intestine without perforation or abscess without bleeding: Secondary | ICD-10-CM | POA: Diagnosis not present

## 2018-10-03 DIAGNOSIS — D500 Iron deficiency anemia secondary to blood loss (chronic): Secondary | ICD-10-CM | POA: Insufficient documentation

## 2018-10-03 HISTORY — PX: POLYPECTOMY: SHX5525

## 2018-10-03 HISTORY — PX: ESOPHAGOGASTRODUODENOSCOPY: SHX5428

## 2018-10-03 HISTORY — PX: COLONOSCOPY: SHX5424

## 2018-10-03 HISTORY — PX: BIOPSY: SHX5522

## 2018-10-03 LAB — HEMOGLOBIN AND HEMATOCRIT, BLOOD
HCT: 31.6 % — ABNORMAL LOW (ref 39.0–52.0)
Hemoglobin: 9 g/dL — ABNORMAL LOW (ref 13.0–17.0)

## 2018-10-03 LAB — GLUCOSE, CAPILLARY: Glucose-Capillary: 91 mg/dL (ref 70–99)

## 2018-10-03 SURGERY — COLONOSCOPY
Anesthesia: Moderate Sedation

## 2018-10-03 MED ORDER — MIDAZOLAM HCL 5 MG/5ML IJ SOLN
INTRAMUSCULAR | Status: DC | PRN
  Administered 2018-10-03 (×3): 1 mg via INTRAVENOUS

## 2018-10-03 MED ORDER — LIDOCAINE VISCOUS HCL 2 % MT SOLN
OROMUCOSAL | Status: AC
  Filled 2018-10-03: qty 15

## 2018-10-03 MED ORDER — FLINTSTONES PLUS IRON PO CHEW: 1.0000 | CHEWABLE_TABLET | Freq: Two times a day (BID) | ORAL | Status: AC

## 2018-10-03 MED ORDER — LIDOCAINE VISCOUS HCL 2 % MT SOLN
OROMUCOSAL | Status: DC | PRN
  Administered 2018-10-03: 1 via OROMUCOSAL

## 2018-10-03 MED ORDER — MEPERIDINE HCL 50 MG/ML IJ SOLN
INTRAMUSCULAR | Status: DC | PRN
  Administered 2018-10-03 (×2): 25 mg via INTRAVENOUS

## 2018-10-03 MED ORDER — SODIUM CHLORIDE 0.9 % IV SOLN
INTRAVENOUS | Status: DC
  Administered 2018-10-03: 07:00:00 via INTRAVENOUS

## 2018-10-03 MED ORDER — MEPERIDINE HCL 50 MG/ML IJ SOLN
INTRAMUSCULAR | Status: AC
  Filled 2018-10-03: qty 1

## 2018-10-03 MED ORDER — STERILE WATER FOR IRRIGATION IR SOLN
Status: DC | PRN
  Administered 2018-10-03: 1.5 mL

## 2018-10-03 MED ORDER — MIDAZOLAM HCL 5 MG/5ML IJ SOLN
INTRAMUSCULAR | Status: AC
  Filled 2018-10-03: qty 10

## 2018-10-03 NOTE — Discharge Instructions (Signed)
No aspirin or NSAIDs for 1 week. Can take Tylenol up to 2 g/day on as-needed basis for headache or joint pain etc. Resume other medications as before. Flintstone chewable 1 tablet by mouth twice daily. Modified carb high-fiber diet. No driving for 24 hours.   Physician will call with results of biopsy and blood test. Colonoscopy, Adult, Care After This sheet gives you information about how to care for yourself after your procedure. Your health care provider may also give you more specific instructions. If you have problems or questions, contact your health care provider.  Dr Laural Golden: [phone].  After hours and weekends call the hospital and have the GI doctor on call paged; they will call you back. What can I expect after the procedure? After the procedure, it is common to have:  A small amount of blood in your stool for 24 hours after the procedure.  Some gas.  Mild abdominal cramping or bloating. Follow these instructions at home: General instructions  For the first 24 hours after the procedure: ? Do not drive or use machinery. ? Do not sign important documents. ? Do not drink alcohol. ? Do your regular daily activities at a slower pace than normal. ? Eat soft, easy-to-digest foods.  Take over-the-counter or prescription medicines only as told by your health care provider. Relieving cramping and bloating   Try walking around when you have cramps or feel bloated.  Eating and drinking   Drink enough fluid to keep your urine pale yellow.  Resume your normal diet as instructed by your health care provider. Avoid heavy or fried foods that are hard to digest. Contact a health care provider if:  You have blood in your stool 2-3 days after the procedure. Get help right away if:  You have more than a small spotting of blood in your stool.  You pass large blood clots in your stool.  Your abdomen is swollen.  You have nausea or vomiting.  You have a fever.  You have  increasing abdominal pain that is not relieved with medicine. Summary  After the procedure, it is common to have a small amount of blood in your stool. You may also have mild abdominal cramping and bloating.  For the first 24 hours after the procedure, do not drive or use machinery, sign important documents, or drink alcohol.  Contact your health care provider if you have a lot of blood in your stool, nausea or vomiting, a fever, or increased abdominal pain. This information is not intended to replace advice given to you by your health care provider. Make sure you discuss any questions you have with your health care provider. Document Released: 01/05/2004 Document Revised: 03/15/2017 Document Reviewed: 08/04/2015 Elsevier Interactive Patient Education  2019 [facility], Adult, Care After This sheet gives you information about how to care for yourself after your procedure. Your health care provider may also give you more specific instructions. If you have problems or questions, contact your health care provider. What can I expect after the procedure? After the procedure, it is common to have:  A sore throat.  Mild stomach pain or discomfort.  Bloating.  Nausea. Follow these instructions at home:   Follow instructions from your health care provider about what to eat or drink after your procedure.  Return to your normal activities as told by your health care provider. Ask your health care provider what activities are safe for you.  Take over-the-counter and prescription medicines only as told  by your health care provider.  Do not drive for 24 hours if you were given a sedative during your procedure.  Keep all follow-up visits as told by your health care provider. This is important. Contact a health care provider if you have:  A sore throat that lasts longer than one day.  Trouble swallowing. Get help right away if:  You vomit blood or your vomit looks  like coffee grounds.  You have: ? A fever. ? Bloody, black, or tarry stools. ? A severe sore throat or you cannot swallow. ? Difficulty breathing. ? Severe pain in your chest or abdomen. Summary  After the procedure, it is common to have a sore throat, mild stomach discomfort, bloating, and nausea.  Do not drive for 24 hours if you were given a sedative during the procedure.  Follow instructions from your health care provider about what to eat or drink after your procedure.  Return to your normal activities as told by your health care provider. This information is not intended to replace advice given to you by your health care provider. Make sure you discuss any questions you have with your health care provider. Document Released: 11/22/2011 Document Revised: 10/23/2017 Document Reviewed: 10/23/2017 Elsevier Interactive Patient Education  2019 [employer].

## 2018-10-03 NOTE — Op Note (Signed)
[facility] Patient Name: Tyler Rodgers Procedure Date: 10/03/2018 8:01 AM MRN: [id number] Date of Birth: 10-Sep-1940 Attending MD: Hildred Laser , MD CSN: [id number] Age: 78 Admit Type: Outpatient Procedure:                Colonoscopy Indications:              Unexplained iron deficiency anemia Providers:                Hildred Laser, MD, Charlsie Quest. Theda Sers RN, RN,                            Raphael Gibney, Technician, Aram Candela Referring MD:             Lenard Simmer, MD Medicines:                None Complications:            No immediate complications. Estimated Blood Loss:     Estimated blood loss was minimal. Procedure:                Pre-Anesthesia Assessment:                           - Prior to the procedure, a History and Physical                            was performed, and patient medications and                            allergies were reviewed. The patient's tolerance of                            previous anesthesia was also reviewed. The risks                            and benefits of the procedure and the sedation                            options and risks were discussed with the patient.                            All questions were answered, and informed consent                            was obtained. Prior Anticoagulants: The patient has                            taken no previous anticoagulant or antiplatelet                            agents except for aspirin. ASA Grade Assessment: II                            - A patient with mild systemic disease. After  reviewing the risks and benefits, the patient was                            deemed in satisfactory condition to undergo the                            procedure.                           After obtaining informed consent, the colonoscope                            was passed under direct vision. Throughout the                            procedure, the patient's  blood pressure, pulse, and                            oxygen saturations were monitored continuously. The                            PCF-H190DL ([id number]) was introduced through the                            anus and advanced to the the cecum, identified by                            appendiceal orifice and ileocecal valve. The                            colonoscopy was performed without difficulty. The                            patient tolerated the procedure well. The quality                            of the bowel preparation was adequate. The                            ileocecal valve, appendiceal orifice, and rectum                            were photographed. Scope In: 8:04:18 AM Scope Out: 8:35:50 AM Scope Withdrawal Time: 0 hours 17 minutes 56 seconds  Total Procedure Duration: 0 hours 31 minutes 32 seconds  Findings:      The perianal and digital rectal examinations were normal.      Multiple small and large-mouthed diverticula were found in the entire       colon.      Four polyps were found in the hepatic flexure. The polyps were small in       size. These polyps were removed with a cold snare. Resection and       retrieval were complete. The pathology specimen was placed into Bottle       Number 3.      A 7 mm  polyp was found in the hepatic flexure. The polyp was       semi-pedunculated. The polyp was removed with a hot snare. Resection and       retrieval were complete. The pathology specimen was placed into Bottle       Number 3.      Two polyps were found in the transverse colon. The polyps were 7 to 8 mm       in size. These polyps were removed with a hot snare. Resection and       retrieval were complete. The pathology specimen was placed into Bottle       Number 2. Estimated blood loss was minimal.      The retroflexed view of the distal rectum and anal verge was normal and       showed no anal or rectal abnormalities. Impression:               - Diverticulosis in  the entire examined colon.                           - Four small polyps at the hepatic flexure, removed                            with a cold snare. Resected and retrieved.                           - One 7 mm polyp at the hepatic flexure, removed                            with a hot snare. Resected and retrieved.                           - Two 7 to 8 mm polyps in the transverse colon,                            removed with a hot snare. Resected and retrieved. Moderate Sedation:      Moderate (conscious) sedation was administered by the endoscopy nurse       and supervised by the endoscopist. The following parameters were       monitored: oxygen saturation, heart rate, blood pressure, CO2       capnography and response to care. Total physician intraservice time was       35 minutes. Recommendation:           - Patient has a contact number available for                            emergencies. The signs and symptoms of potential                            delayed complications were discussed with the                            patient. Return to normal activities tomorrow.                            Written discharge instructions  were provided to the                            patient.                           - High fiber diet and diabetic (ADA) diet today.                           - Continue present medications.                           - Resume Aspirin at prior dose in 1 week.                           - Await pathology results.                           - Check H/H today.                           - flintstone chewable one tablet twice daily.                           - Repeat colonoscopy is recommended. The                            colonoscopy date will be determined after pathology                            results from today's exam become available for                            review. Procedure Code(s):        --- Professional ---                           [phone],  Colonoscopy, flexible; with removal of                            tumor(s), polyp(s), or other lesion(s) by snare                            technique                           99153, Moderate sedation; each additional 15                            minutes intraservice time                           G0500, Moderate sedation services provided by the                            same physician or other qualified health care  professional performing a gastrointestinal                            endoscopic service that sedation supports,                            requiring the presence of an independent trained                            observer to assist in the monitoring of the                            patient's level of consciousness and physiological                            status; initial 15 minutes of intra-service time;                            patient age 25 years or older (additional time may                            be reported with [phone], as appropriate) Diagnosis Code(s):        --- Professional ---                           K63.5, Polyp of colon                           D50.9, Iron deficiency anemia, unspecified                           K57.30, Diverticulosis of large intestine without                            perforation or abscess without bleeding CPT copyright 2019 American Medical Association. All rights reserved. The codes documented in this report are preliminary and upon coder review may  be revised to meet current compliance requirements. Hildred Laser, MD Hildred Laser, MD 10/03/2018 8:57:11 AM This report has been signed electronically. Number of Addenda: 0

## 2018-10-03 NOTE — H&P (Signed)
Tyler Rodgers is an 78 y.o. male.   Chief Complaint: Patient is here for EGD and colonoscopy. HPI: Patient is 78 year old African-American male who was found to have iron deficiency anemia about 2 months ago.  His hemoglobin was 8.8 g.  Serum ferritin was 8 and an iron and saturation were low as well.  Patient was seen in the office over 2 months ago.  He was given 3 Hemoccults but he has not returned them.  He denies melena or rectal bleeding abdominal pain diarrhea nausea vomiting heartburn or dysphagia.  He has fair appetite.  He has never been screened for CRC.  He takes full dose aspirin 2-3 times a week. Family history is negative for CRC.  Past Medical History:  Diagnosis Date  . Anxiety   . Diabetes mellitus without complication ([place])       . Gout   . Kidney stone     Past Surgical History:  Procedure Laterality Date  . CHOLECYSTECTOMY      History reviewed. No pertinent family history. Social History:  reports that he has been smoking. He has been smoking about 0.50 packs per day. He has never used smokeless tobacco. He reports previous alcohol use. No history on file for drug.  Allergies: No Known Allergies  Medications Prior to Admission  Medication Sig Dispense Refill  . cilostazol (PLETAL) 50 MG tablet Take 50 mg by mouth 2 (two) times daily.   5  . metFORMIN (GLUCOPHAGE) 1000 MG tablet Take 500-1,000 mg by mouth See admin instructions. Take 1 tablet (1000 mg) by mouth in the morning and Take 0.5 tablet (500 mg) by mouth at night    . Vitamin D, Ergocalciferol, (DRISDOL) 50000 units CAPS capsule Take 50,000 Units by mouth every Monday.   3  . Multiple Vitamin (MULTIVITAMIN WITH MINERALS) TABS tablet Take 1 tablet by mouth daily.      No results found for this or any previous visit (from the past 48 hour(s)). No results found.  ROS  Blood pressure 137/71, pulse 76, temperature 97.9 F (36.6 C), temperature source Oral, resp. rate (!) 21, SpO2 100 %. Physical  Exam  Constitutional: He appears well-developed and well-nourished.  HENT:  Mouth/Throat: Oropharynx is clear and moist.  Eyes: Conjunctivae are normal. No scleral icterus.  Neck: No thyromegaly present.  Cardiovascular: Normal rate, regular rhythm and normal heart sounds.  No murmur heard. Respiratory: Effort normal and breath sounds normal.  GI: Soft. He exhibits no distension and no mass. There is no abdominal tenderness.  Musculoskeletal:        General: No edema.  Lymphadenopathy:    He has no cervical adenopathy.  Neurological: He is alert.  Skin: Skin is warm and dry.     Assessment/Plan Iron deficiency anemia Diagnostic EGD and colonoscopy.  Hildred Laser, MD 10/03/2018, 7:41 AM

## 2018-10-03 NOTE — Op Note (Signed)
[facility] Patient Name: Tyler Rodgers Procedure Date: 10/03/2018 7:11 AM MRN: [id number] Date of Birth: 30-Nov-1940 Attending MD: Hildred Laser , MD CSN: [id number] Age: 78 Admit Type: Outpatient Procedure:                Upper GI endoscopy Indications:              Unexplained iron deficiency anemia Providers:                Hildred Laser, MD, Charlsie Quest. Theda Sers RN, RN,                            Raphael Gibney, Technician, Aram Candela Referring MD:             Lenard Simmer, MD Medicines:                Lidocaine spray, Meperidine 50 mg IV, Midazolam 3                            mg IV Complications:            No immediate complications. Estimated Blood Loss:     Estimated blood loss was minimal. Procedure:                Pre-Anesthesia Assessment:                           - Prior to the procedure, a History and Physical                            was performed, and patient medications and                            allergies were reviewed. The patient's tolerance of                            previous anesthesia was also reviewed. The risks                            and benefits of the procedure and the sedation                            options and risks were discussed with the patient.                            All questions were answered, and informed consent                            was obtained. Prior Anticoagulants: The patient has                            taken no previous anticoagulant or antiplatelet                            agents except for aspirin. ASA Grade Assessment: II                            -  A patient with mild systemic disease. After                            reviewing the risks and benefits, the patient was                            deemed in satisfactory condition to undergo the                            procedure.                           After obtaining informed consent, the endoscope was                            passed under  direct vision. Throughout the                            procedure, the patient's blood pressure, pulse, and                            oxygen saturations were monitored continuously. The                            [id number]-H190 ([id number]) scope was introduced through the                            mouth, and advanced to the second part of duodenum.                            The upper GI endoscopy was accomplished without                            difficulty. The patient tolerated the procedure                            well. Scope In: 7:52:25 AM Scope Out: 8:01:15 AM Total Procedure Duration: 0 hours 8 minutes 50 seconds  Findings:      The examined esophagus was normal.      The Z-line was regular and was found 39 cm from the incisors.      A single, non-bleeding erosion was found in the gastric antrum. There       were no stigmata of recent bleeding.      Scattered mucosal changes characterized by punctated white spots were       found in the gastric antrum. Biopsies were taken with a cold forceps for       histology. The pathology specimen was placed into Bottle Number 1.      The exam of the stomach was otherwise normal.      The duodenal bulb and second portion of the duodenum were normal. Impression:               - Normal esophagus.                           -  Z-line regular, 39 cm from the incisors.                           - Non-bleeding erosive gastropathy.                           - Punctate white spotted mucosa in the antrum.                            Biopsied.                           - Normal duodenal bulb and second portion of the                            duodenum. Moderate Sedation:      Moderate (conscious) sedation was administered by the endoscopy nurse       and supervised by the endoscopist. The following parameters were       monitored: oxygen saturation, heart rate, blood pressure, CO2       capnography and response to care. Total physician intraservice time  was       15 minutes. Recommendation:           - Patient has a contact number available for                            emergencies. The signs and symptoms of potential                            delayed complications were discussed with the                            patient. Return to normal activities tomorrow.                            Written discharge instructions were provided to the                            patient.                           - High fiber diet and diabetic (ADA) diet today.                           - Continue present medications.                           - Await pathology results.                           - See the other procedure note for documentation of                            additional recommendations. Procedure Code(s):        --- Professional ---  16109, Esophagogastroduodenoscopy, flexible,                            transoral; with biopsy, single or multiple                           G0500, Moderate sedation services provided by the                            same physician or other qualified health care                            professional performing a gastrointestinal                            endoscopic service that sedation supports,                            requiring the presence of an independent trained                            observer to assist in the monitoring of the                            patient's level of consciousness and physiological                            status; initial 15 minutes of intra-service time;                            patient age 43 years or older (additional time may                            be reported with [phone], as appropriate) Diagnosis Code(s):        --- Professional ---                           K31.89, Other diseases of stomach and duodenum                           D50.9, Iron deficiency anemia, unspecified CPT copyright 2019 American Medical Association. All  rights reserved. The codes documented in this report are preliminary and upon coder review may  be revised to meet current compliance requirements. Hildred Laser, MD Hildred Laser, MD 10/03/2018 8:49:24 AM This report has been signed electronically. Number of Addenda: 0

## 2018-10-05 ENCOUNTER — Encounter (HOSPITAL_COMMUNITY): Payer: Self-pay | Admitting: Internal Medicine

## 2018-10-08 ENCOUNTER — Other Ambulatory Visit (INDEPENDENT_AMBULATORY_CARE_PROVIDER_SITE_OTHER): Payer: Self-pay | Admitting: *Deleted

## 2018-10-08 DIAGNOSIS — D500 Iron deficiency anemia secondary to blood loss (chronic): Secondary | ICD-10-CM

## 2018-10-24 ENCOUNTER — Encounter (INDEPENDENT_AMBULATORY_CARE_PROVIDER_SITE_OTHER): Payer: Self-pay | Admitting: *Deleted

## 2018-10-24 ENCOUNTER — Other Ambulatory Visit (INDEPENDENT_AMBULATORY_CARE_PROVIDER_SITE_OTHER): Payer: Self-pay | Admitting: *Deleted

## 2018-10-24 DIAGNOSIS — D500 Iron deficiency anemia secondary to blood loss (chronic): Secondary | ICD-10-CM

## 2019-04-20 IMAGING — MR MR HEAD W/O CM
6 of 10 series · 30 of 48 positions shown · non-contrast
Comparison: Head CT from 2 days ago

CLINICAL DATA: Subacute neuro deficits.  Speech difficulty.

EXAM:
MRI HEAD WITHOUT CONTRAST
TECHNIQUE: Multiplanar, multiecho pulse sequences of the brain and surrounding
structures were obtained without intravenous contrast.

[Series 3: DWI · axial · 3.0mm · 0.74mm/px · z∈[-103,+59]mm · 6 of 55 slices shown (1 of 4)]
[im 1/55]
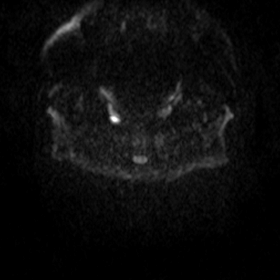
[im 11/55]
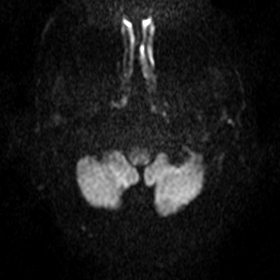
[im 22/55]
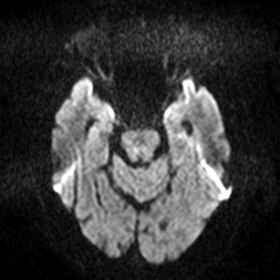
[im 33/55]
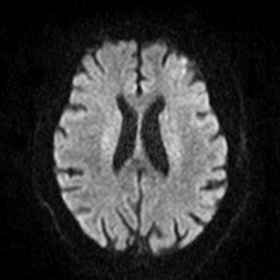
[im 44/55]
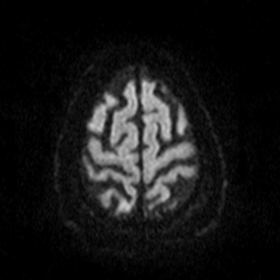
[im 55/55]
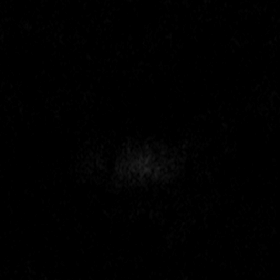

[Series 4: DWI · axial · 3.0mm · 0.74mm/px · z∈[-103,+59]mm · 6 of 55 slices shown (2 of 4)]
[im 1/55]
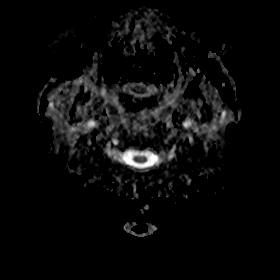
[im 11/55]
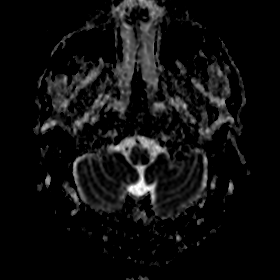
[im 22/55]
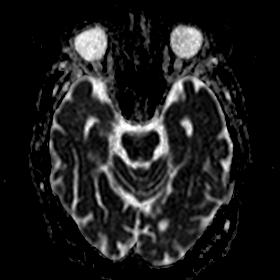
[im 33/55]
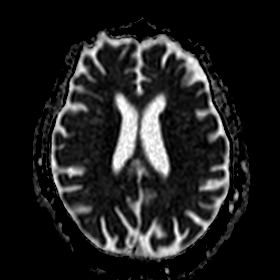
[im 44/55]
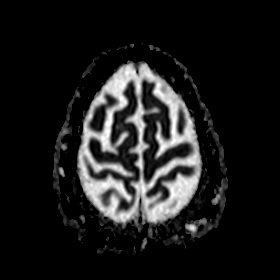
[im 55/55]
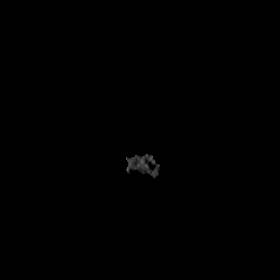

[Series 5: DWI · coronal · 5.0mm · 0.49mm/px · 5 of 38 slices shown (3 of 4)]
[im 1/38]
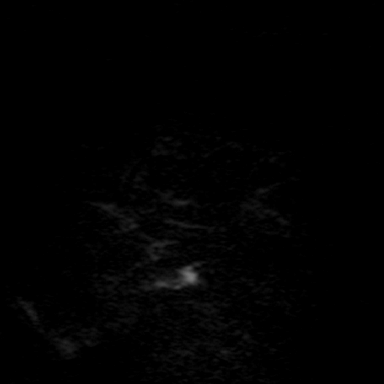
[im 10/38]
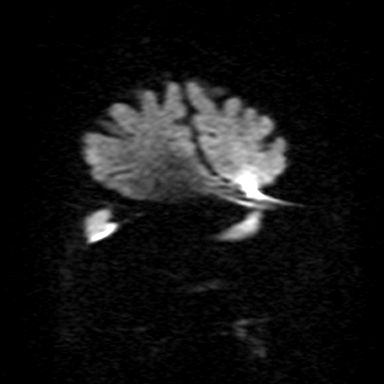
[im 19/38]
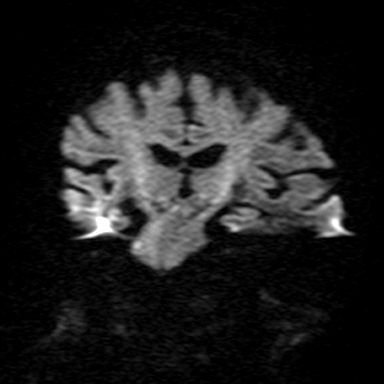
[im 28/38]
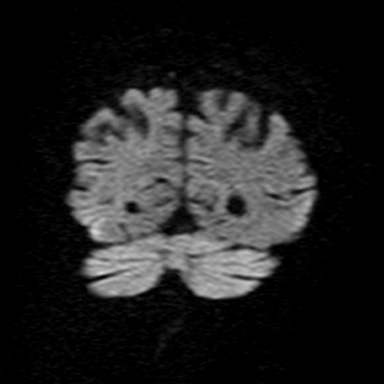
[im 38/38]
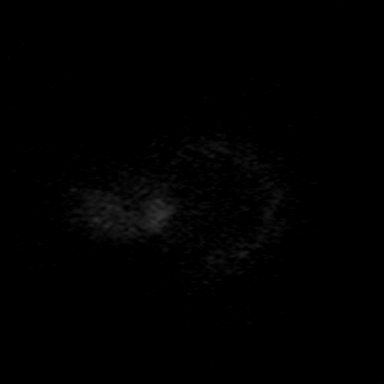

[Series 6: DWI · coronal · 5.0mm · 0.49mm/px · 5 of 38 slices shown (4 of 4)]
[im 1/38]
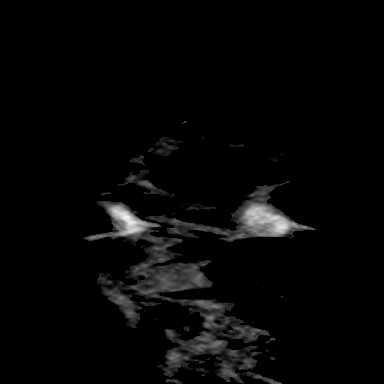
[im 10/38]
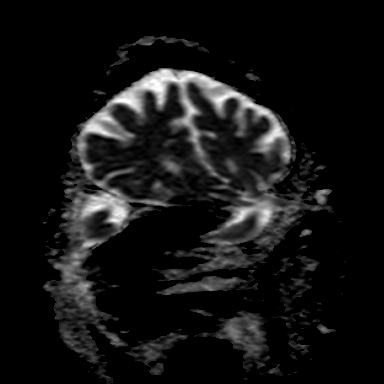
[im 19/38]
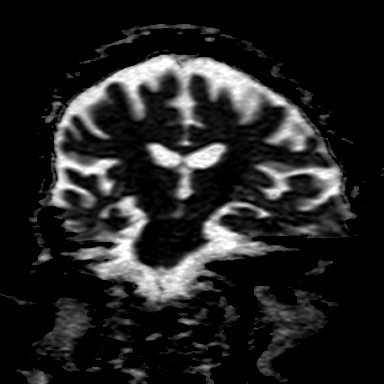
[im 28/38]
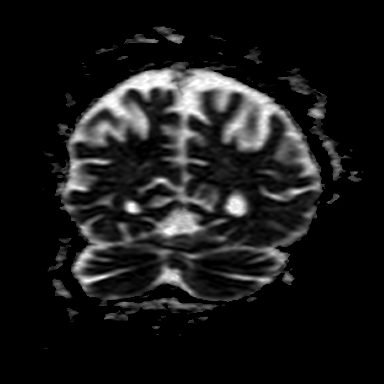
[im 38/38]
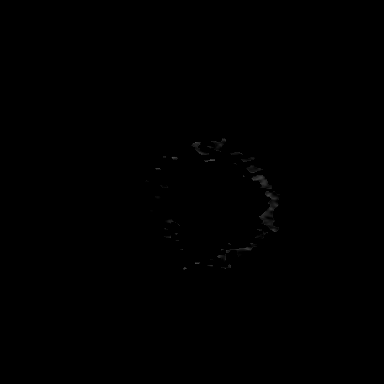

[Series 8: T2 · axial · 5.0mm · 0.51mm/px · z∈[-94,-22]mm · 2 of 23 slices shown]
[im 1/23]
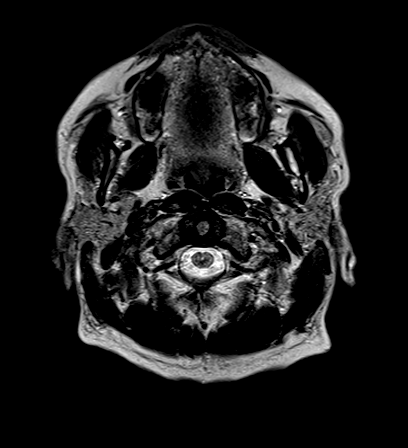
[im 12/23]
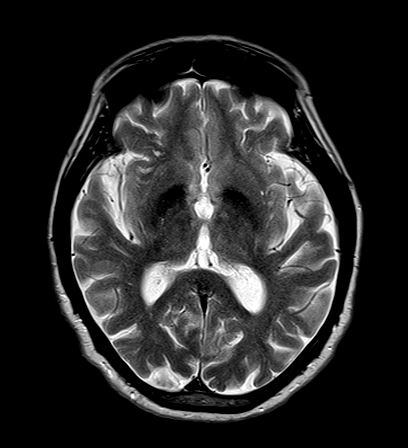

[Series 9: FLAIR · axial · 3.0mm · 0.34mm/px · z∈[-99,+54]mm · 6 of 52 slices shown]
[im 1/52]
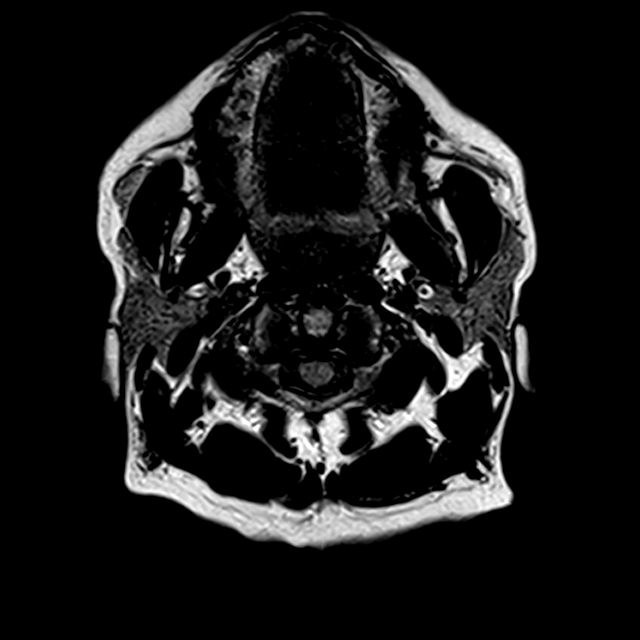
[im 11/52]
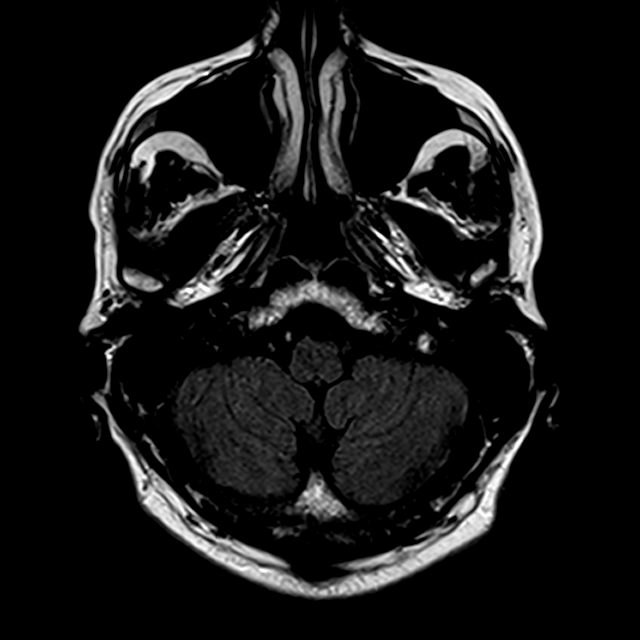
[im 21/52]
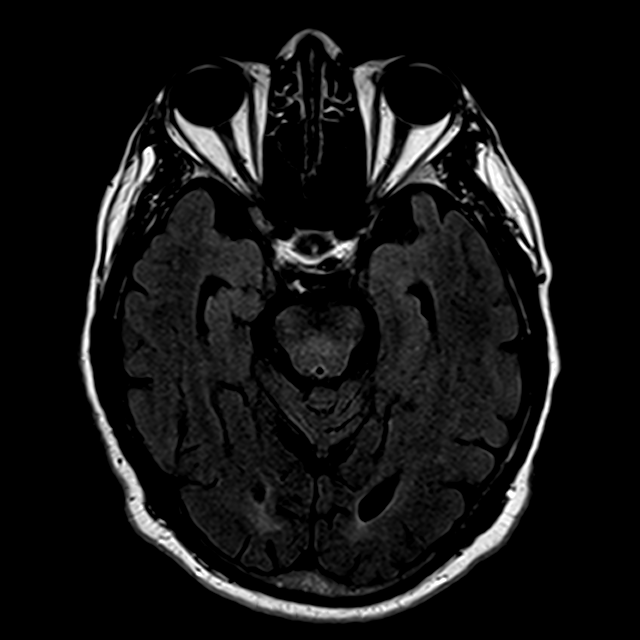
[im 31/52]
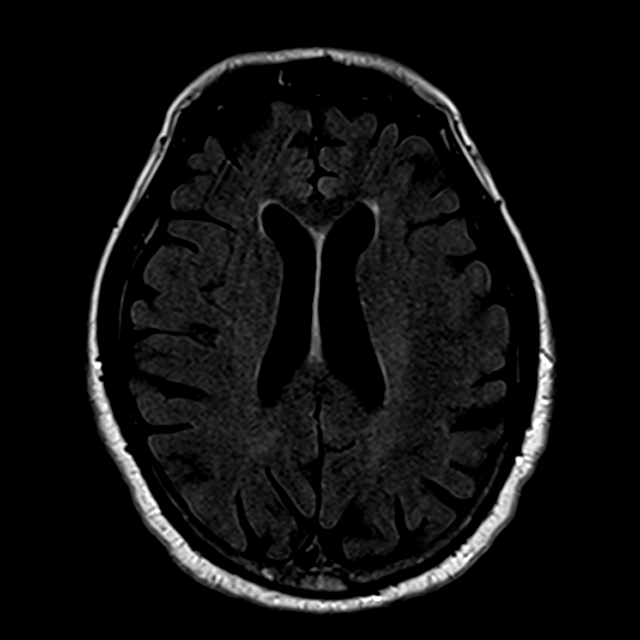
[im 41/52]
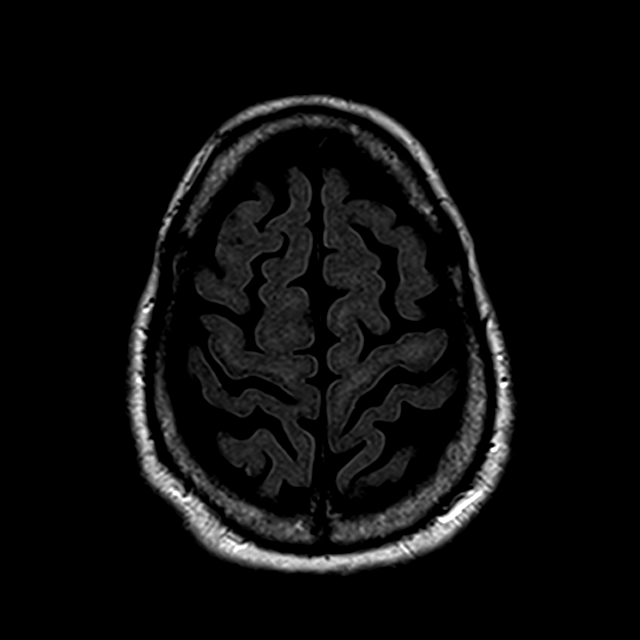
[im 52/52]
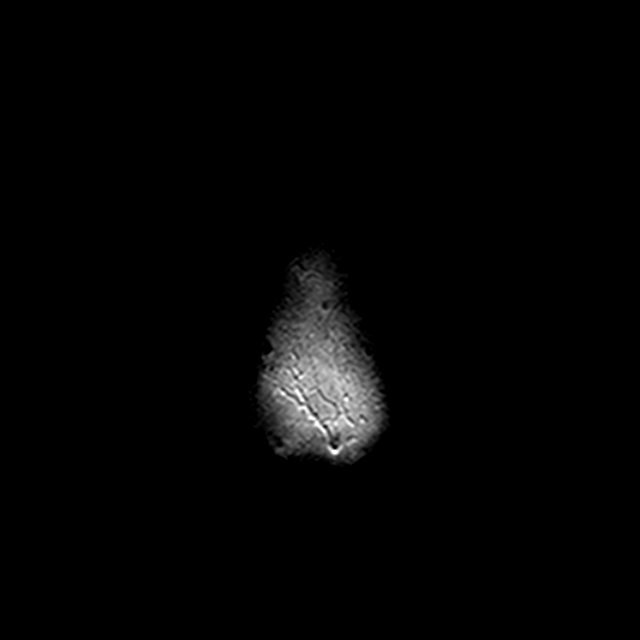

[30 of 48 positions shown; findings below may reference images not displayed]

FINDINGS: Brain: No acute infarction, hemorrhage, hydrocephalus, extra-axial
collection or mass lesion. Dilated perivascular space below the
right basal ganglia. Mild for age cerebral volume loss. Minimal
white matter FLAIR hyperintensity.

Vascular: Major flow voids are preserved

Skull and upper cervical spine: Negative for marrow lesion

Sinuses/Orbits: Negative
IMPRESSION: Age normal brain MRI.

## 2021-10-08 ENCOUNTER — Ambulatory Visit
Admission: EM | Admit: 2021-10-08 | Discharge: 2021-10-08 | Disposition: A | Discharge status: Home / Self Care | Payer: Medicare Other

## 2021-10-08 DIAGNOSIS — S90822A Blister (nonthermal), left foot, initial encounter: Secondary | ICD-10-CM | POA: Diagnosis not present

## 2021-10-08 NOTE — ED Provider Notes (Signed)
?[place] ? ? ? ?CSN: [id number] ?Arrival date & time: 10/08/21  1048 ? ? ?  ? ?History   ?Chief Complaint ?Chief Complaint  ?Patient presents with  ? Abscess  ? ? ?HPI ?Tyler Rodgers is a 81 y.o. male presenting with concern for blister - L foot. History diabetes. Here today with wife. Patient is very hard of hearing. Describes blister L foot x1 day. He attributes this to his sneakers, which have been rubbing. Has applied a bandaid without relief. feeling well otherwise. Denies blisters or lesions elsewhere. ? ?HPI ? ?Past Medical History:  ?Diagnosis Date  ? Anxiety   ? Diabetes mellitus without complication ([place])   ? GERD (gastroesophageal reflux disease)   ? Gout   ? Kidney stone   ? ? ?Patient Active Problem List  ? Diagnosis Date Noted  ? Iron deficiency anemia due to chronic blood loss 07/17/2018  ? Tremor   ? Tobacco abuse 12/07/2017  ? Rhabdomyolysis 12/07/2017  ? Acute metabolic encephalopathy 16/55/3748  ? Transaminasemia   ? AKI (acute kidney injury) ([place]) 12/06/2017  ? Diabetes mellitus without complication ([place])   ? GERD (gastroesophageal reflux disease)   ? Elevated CK   ? ? ?Past Surgical History:  ?Procedure Laterality Date  ? BIOPSY  10/03/2018  ? Procedure: BIOPSY;  Surgeon: Rogene Houston, MD;  [facility]: [facility];  Service: Endoscopy;;  gastric  ? CHOLECYSTECTOMY    ? COLONOSCOPY N/A 10/03/2018  ? Procedure: COLONOSCOPY;  Surgeon: Rogene Houston, MD;  Location: [facility];  Service: Endoscopy;  Laterality: N/A;  1:55  ? ESOPHAGOGASTRODUODENOSCOPY N/A 10/03/2018  ? Procedure: ESOPHAGOGASTRODUODENOSCOPY (EGD);  Surgeon: Rogene Houston, MD;  Location: [facility];  Service: Endoscopy;  Laterality: N/A;  ? POLYPECTOMY  10/03/2018  ? Procedure: POLYPECTOMY;  Surgeon: Rogene Houston, MD;  Location: [facility];  Service: Endoscopy;;  transverse colon(HSx2), hepatic flexure(CSx5)  ? ? ? ? ? ?Home Medications   ? ?Prior to Admission medications   ?Medication Sig Start Date  End Date Taking? Authorizing Provider  ?aspirin 325 MG tablet Take 325 mg by mouth daily.   Yes [provider]  ?cilostazol (PLETAL) 50 MG tablet Take 50 mg by mouth 2 (two) times daily.  12/02/17   [provider]  ?metFORMIN (GLUCOPHAGE) 1000 MG tablet Take 500-1,000 mg by mouth See admin instructions. Take 1 tablet (1000 mg) by mouth in the morning and Take 0.5 tablet (500 mg) by mouth at night    [provider]  ?Multiple Vitamin (MULTIVITAMIN WITH MINERALS) TABS tablet Take 1 tablet by mouth daily.    [provider]  ?Pediatric Multivitamins-Iron (FLINTSTONES PLUS IRON) chewable tablet Chew 1 tablet by mouth 2 (two) times daily. 10/03/18   Rogene Houston, MD  ?Vitamin D, Ergocalciferol, (DRISDOL) 50000 units CAPS capsule Take 50,000 Units by mouth every Monday.  09/24/17   [provider]  ? ? ?Family History ?History reviewed. No pertinent family history. ? ?Social History ?Social History  ? ?Tobacco Use  ? Smoking status: Every Day  ?  Packs/day: 0.50  ?  Types: Cigarettes  ? Smokeless tobacco: Never  ?Vaping Use  ? Vaping Use: Never used  ?Substance Use Topics  ? Alcohol use: Not Currently  ? ? ? ?Allergies   ?Patient has no known allergies. ? ? ?Review of Systems ?Review of Systems  ?Skin:  Positive for color change.  ?All other systems reviewed and are negative. ? ? ?  Physical Exam ?Triage Vital Signs ?ED Triage Vitals  ?Enc Vitals Group  ?   BP 10/08/21 1222 (!) 152/71  ?   Pulse Rate 10/08/21 1222 67  ?   Resp 10/08/21 1222 20  ?   Temp 10/08/21 1222 98.7 ?F (37.1 ?C)  ?   Temp src --   ?   SpO2 10/08/21 1222 99 %  ?   Weight --   ?   Height --   ?   Head Circumference --   ?   Peak Flow --   ?   Pain Score 10/08/21 1223 0  ?   Pain Loc --   ?   Pain Edu? --   ?   Excl. in [place]? --   ? ?No data found. ? ?Updated Vital Signs ?BP (!) 152/71   Pulse 67   Temp 98.7 ?F (37.1 ?C)   Resp 20   SpO2 99%  ? ?Visual Acuity ?Right Eye Distance:   ?Left Eye Distance:    ?Bilateral Distance:   ? ?Right Eye Near:   ?Left Eye Near:    ?Bilateral Near:    ? ?Physical Exam ?Vitals reviewed.  ?Constitutional:   ?   General: He is not in acute distress. ?   Appearance: Normal appearance. He is not ill-appearing.  ?HENT:  ?   Head: Normocephalic and atraumatic.  ?Pulmonary:  ?   Effort: Pulmonary effort is normal.  ?Skin: ?   Comments: See image below ?L dorsal foot - base of little toe with small 8x68m blister. No spontaneous drainage. No surrounding erythema. No lesion on plantar foot or between toes. DP palpable.  ?Neurological:  ?   General: No focal deficit present.  ?   Mental Status: He is alert and oriented to person, place, and time.  ?Psychiatric:     ?   Mood and Affect: Mood normal.     ?   Behavior: Behavior normal.     ?   Thought Content: Thought content normal.     ?   Judgment: Judgment normal.  ? ? ? ? ?UC Treatments / Results  ?Labs ?(all labs ordered are listed, but only abnormal results are displayed) ?Labs Reviewed - No data to display ? ?EKG ? ? ?Radiology ?No results found. ? ?Procedures ?Procedures (including critical care time) ? ?Medications Ordered in UC ?Medications - No data to display ? ?Initial Impression / Assessment and Plan / UC Course  ?I have reviewed the triage vital signs and the nursing notes. ? ?Pertinent labs & imaging results that were available during my care of the patient were reviewed by me and considered in my medical decision making (see chart for details). ? ?  ? ?This patient is a very pleasant 81y.o. year old male presenting with blister L foot. Neurovascularly intact. Suspect this is related to shoes. Advised him to go barefoot around the house. Warm compresses. F/u with podiatry for further concerns. ED return precautions discussed. Patient and wife verbalizes understanding and agreement.  ?.  ? ?Final Clinical Impressions(s) / UC Diagnoses  ? ?Final diagnoses:  ?Blister of left foot, initial encounter  ? ? ? ?Discharge Instructions    ? ?  ?-Warm compress, wash with gentle soap and water only ?-Do not pop the blister as this increases chance of infection ?-As he is a diabetic, it's a good idea to follow-up with a podiatrist ?-Follow-up if the area changes, like it becomes red, swollen, tender, spreads, etc.  ? ? ?  ED Prescriptions   ?None ?  ? ?PDMP not reviewed this encounter. ?  ?Hazel Sams, PA-C ?10/08/21 1349 ? ?

## 2021-10-08 NOTE — Discharge Instructions (Addendum)
-  Warm compress, wash with gentle soap and water only ?-Do not pop the blister as this increases chance of infection ?-As he is a diabetic, it's a good idea to follow-up with a podiatrist ?-Follow-up if the area changes, like it becomes red, swollen, tender, spreads, etc.  ?

## 2021-10-08 NOTE — ED Triage Notes (Signed)
Pt presents with large blister or abscess on top of left foot for past couple of days  ?

## 2023-09-14 ENCOUNTER — Encounter (INDEPENDENT_AMBULATORY_CARE_PROVIDER_SITE_OTHER): Payer: Self-pay | Admitting: *Deleted

## 2024-03-08 ENCOUNTER — Ambulatory Visit (HOSPITAL_COMMUNITY)
Admission: RE | Admit: 2024-03-08 | Discharge: 2024-03-08 | Disposition: A | Discharge status: Home / Self Care | Source: Ambulatory Visit | Attending: Gerontology | Admitting: Gerontology

## 2024-03-08 ENCOUNTER — Other Ambulatory Visit (HOSPITAL_COMMUNITY)
Admission: RE | Admit: 2024-03-08 | Discharge: 2024-03-08 | Disposition: A | Discharge status: Home / Self Care | Source: Ambulatory Visit | Attending: Internal Medicine | Admitting: Internal Medicine

## 2024-03-08 ENCOUNTER — Other Ambulatory Visit (HOSPITAL_COMMUNITY): Payer: Self-pay | Admitting: Gerontology

## 2024-03-08 DIAGNOSIS — R0981 Nasal congestion: Secondary | ICD-10-CM | POA: Diagnosis not present

## 2024-03-08 DIAGNOSIS — E1169 Type 2 diabetes mellitus with other specified complication: Secondary | ICD-10-CM | POA: Insufficient documentation

## 2024-03-08 DIAGNOSIS — D519 Vitamin B12 deficiency anemia, unspecified: Secondary | ICD-10-CM | POA: Diagnosis not present

## 2024-03-08 DIAGNOSIS — R059 Cough, unspecified: Secondary | ICD-10-CM | POA: Insufficient documentation

## 2024-03-08 DIAGNOSIS — R600 Localized edema: Secondary | ICD-10-CM | POA: Insufficient documentation

## 2024-03-08 DIAGNOSIS — R531 Weakness: Secondary | ICD-10-CM | POA: Diagnosis present

## 2024-03-08 LAB — LIPID PANEL
Cholesterol: 137 mg/dL (ref 0–200)
HDL: 39 mg/dL — ABNORMAL LOW (ref >40)
LDL Cholesterol: 83 mg/dL (ref 0–99)
Total CHOL/HDL Ratio: 3.6 ratio
Triglycerides: 79 mg/dL (ref <150)
VLDL: 16 mg/dL (ref 0–40)

## 2024-03-08 LAB — BASIC METABOLIC PANEL WITH GFR
Anion gap: 10 (ref 5–15)
BUN: 15 mg/dL (ref 8–23)
CO2: 24 mmol/L (ref 22–32)
Calcium: 9.3 mg/dL (ref 8.9–10.3)
Chloride: 104 mmol/L (ref 98–111)
Creatinine, Ser: 1.79 mg/dL — ABNORMAL HIGH (ref 0.61–1.24)
GFR, Estimated: 37 mL/min — ABNORMAL LOW (ref >60)
Glucose, Bld: 133 mg/dL — ABNORMAL HIGH (ref 70–99)
Potassium: 5 mmol/L (ref 3.5–5.1)
Sodium: 138 mmol/L (ref 135–145)

## 2024-03-08 LAB — CBC WITH DIFFERENTIAL/PLATELET
Abs Immature Granulocytes: 0.01 K/uL (ref 0.00–0.07)
Basophils Absolute: 0.1 K/uL (ref 0.0–0.1)
Basophils Relative: 1 %
Eosinophils Absolute: 0.2 K/uL (ref 0.0–0.5)
Eosinophils Relative: 3 %
HCT: 31.8 % — ABNORMAL LOW (ref 39.0–52.0)
Hemoglobin: 9.1 g/dL — ABNORMAL LOW (ref 13.0–17.0)
Immature Granulocytes: 0 %
Lymphocytes Relative: 32 %
Lymphs Abs: 2.2 K/uL (ref 0.7–4.0)
MCH: 22.6 pg — ABNORMAL LOW (ref 26.0–34.0)
MCHC: 28.6 g/dL — ABNORMAL LOW (ref 30.0–36.0)
MCV: 79.1 fL — ABNORMAL LOW (ref 80.0–100.0)
Monocytes Absolute: 0.6 K/uL (ref 0.1–1.0)
Monocytes Relative: 9 %
Neutro Abs: 3.7 K/uL (ref 1.7–7.7)
Neutrophils Relative %: 55 %
Platelets: 276 K/uL (ref 150–400)
RBC: 4.02 MIL/uL — ABNORMAL LOW (ref 4.22–5.81)
RDW: 17.6 % — ABNORMAL HIGH (ref 11.5–15.5)
WBC: 6.7 K/uL (ref 4.0–10.5)
nRBC: 0 % (ref 0.0–0.2)

## 2024-03-08 LAB — VITAMIN B12: Vitamin B-12: 661 pg/mL (ref 180–914)

## 2024-03-08 LAB — HEPATIC FUNCTION PANEL
ALT: 15 U/L (ref 0–44)
AST: 20 U/L (ref 15–41)
Albumin: 4.2 g/dL (ref 3.5–5.0)
Alkaline Phosphatase: 94 U/L (ref 38–126)
Bilirubin, Direct: 0.4 mg/dL — ABNORMAL HIGH (ref 0.0–0.2)
Indirect Bilirubin: 0.5 mg/dL (ref 0.3–0.9)
Total Bilirubin: 1 mg/dL (ref 0.0–1.2)
Total Protein: 7.4 g/dL (ref 6.5–8.1)

## 2024-03-08 LAB — FOLATE: Folate: 19.6 ng/mL (ref >5.9)

## 2024-03-08 LAB — HEMOGLOBIN A1C
Hgb A1c MFr Bld: 6.5 % — ABNORMAL HIGH (ref 4.8–5.6)
Mean Plasma Glucose: 139.85 mg/dL

## 2024-03-09 LAB — MICROALBUMIN / CREATININE URINE RATIO
Creatinine, Urine: 115 mg/dL
Microalb Creat Ratio: 43 mg/g{creat} — ABNORMAL HIGH (ref 0–29)
Microalb, Ur: 49.9 ug/mL — ABNORMAL HIGH

## 2024-03-22 ENCOUNTER — Other Ambulatory Visit (HOSPITAL_COMMUNITY): Payer: Self-pay | Admitting: Gerontology

## 2024-03-22 DIAGNOSIS — I739 Peripheral vascular disease, unspecified: Secondary | ICD-10-CM

## 2024-04-03 ENCOUNTER — Ambulatory Visit (HOSPITAL_COMMUNITY)
Admission: RE | Admit: 2024-04-03 | Discharge: 2024-04-03 | Disposition: A | Discharge status: Home / Self Care | Source: Ambulatory Visit | Attending: Gerontology | Admitting: Gerontology

## 2024-04-03 DIAGNOSIS — I739 Peripheral vascular disease, unspecified: Secondary | ICD-10-CM | POA: Diagnosis present
